# Patient Record
Sex: Female | Born: 1940 | Race: White | Hispanic: No | Smoking: Never smoker
Health system: Southern US, Community
[De-identification: ages and names within clinical notes are randomized; demographics above are authoritative.]

## PROBLEM LIST (undated history)

## (undated) DIAGNOSIS — F419 Anxiety disorder, unspecified: Secondary | ICD-10-CM

## (undated) DIAGNOSIS — K219 Gastro-esophageal reflux disease without esophagitis: Secondary | ICD-10-CM

## (undated) DIAGNOSIS — M1990 Unspecified osteoarthritis, unspecified site: Secondary | ICD-10-CM

## (undated) DIAGNOSIS — F32A Depression, unspecified: Secondary | ICD-10-CM

## (undated) DIAGNOSIS — IMO0002 Reserved for concepts with insufficient information to code with codable children: Secondary | ICD-10-CM

## (undated) DIAGNOSIS — C801 Malignant (primary) neoplasm, unspecified: Secondary | ICD-10-CM

## (undated) DIAGNOSIS — I639 Cerebral infarction, unspecified: Secondary | ICD-10-CM

## (undated) DIAGNOSIS — F329 Major depressive disorder, single episode, unspecified: Secondary | ICD-10-CM

## (undated) DIAGNOSIS — H269 Unspecified cataract: Secondary | ICD-10-CM

## (undated) DIAGNOSIS — F0390 Unspecified dementia without behavioral disturbance: Secondary | ICD-10-CM

## (undated) DIAGNOSIS — I10 Essential (primary) hypertension: Secondary | ICD-10-CM

## (undated) DIAGNOSIS — E785 Hyperlipidemia, unspecified: Secondary | ICD-10-CM

## (undated) DIAGNOSIS — T7840XA Allergy, unspecified, initial encounter: Secondary | ICD-10-CM

## (undated) DIAGNOSIS — E119 Type 2 diabetes mellitus without complications: Secondary | ICD-10-CM

## (undated) DIAGNOSIS — E876 Hypokalemia: Secondary | ICD-10-CM

## (undated) HISTORY — DX: Cerebral infarction, unspecified: I63.9

## (undated) HISTORY — DX: Type 2 diabetes mellitus without complications: E11.9

## (undated) HISTORY — DX: Essential (primary) hypertension: I10

## (undated) HISTORY — DX: Unspecified dementia, unspecified severity, without behavioral disturbance, psychotic disturbance, mood disturbance, and anxiety: F03.90

## (undated) HISTORY — DX: Reserved for concepts with insufficient information to code with codable children: IMO0002

## (undated) HISTORY — DX: Unspecified cataract: H26.9

## (undated) HISTORY — DX: Gastro-esophageal reflux disease without esophagitis: K21.9

## (undated) HISTORY — DX: Hyperlipidemia, unspecified: E78.5

## (undated) HISTORY — DX: Malignant (primary) neoplasm, unspecified: C80.1

## (undated) HISTORY — DX: Anxiety disorder, unspecified: F41.9

## (undated) HISTORY — DX: Major depressive disorder, single episode, unspecified: F32.9

## (undated) HISTORY — DX: Unspecified osteoarthritis, unspecified site: M19.90

## (undated) HISTORY — DX: Depression, unspecified: F32.A

## (undated) HISTORY — DX: Allergy, unspecified, initial encounter: T78.40XA

---

## 2013-02-07 ENCOUNTER — Ambulatory Visit: Payer: Self-pay | Admitting: Podiatrist

## 2013-02-14 ENCOUNTER — Ambulatory Visit: Payer: Self-pay | Admitting: Podiatrist

## 2013-02-21 ENCOUNTER — Encounter: Payer: Self-pay | Admitting: Podiatrist

## 2013-02-21 ENCOUNTER — Encounter (INDEPENDENT_AMBULATORY_CARE_PROVIDER_SITE_OTHER): Payer: Self-pay

## 2013-02-21 ENCOUNTER — Ambulatory Visit (INDEPENDENT_AMBULATORY_CARE_PROVIDER_SITE_OTHER): Payer: Medicare PPO | Admitting: Podiatrist

## 2013-02-21 VITALS — BP 140/67 | HR 73 | Resp 16

## 2013-02-21 DIAGNOSIS — M79609 Pain in unspecified limb: Secondary | ICD-10-CM

## 2013-02-21 DIAGNOSIS — B351 Tinea unguium: Secondary | ICD-10-CM

## 2013-02-21 DIAGNOSIS — L97509 Non-pressure chronic ulcer of other part of unspecified foot with unspecified severity: Secondary | ICD-10-CM

## 2013-02-21 DIAGNOSIS — L97521 Non-pressure chronic ulcer of other part of left foot limited to breakdown of skin: Secondary | ICD-10-CM

## 2013-02-21 MED ORDER — "NEXCARE COBAN WRAP 1""X5YD MISC": 1.0000 "application " | Freq: Every day | Status: DC

## 2013-02-21 NOTE — Patient Instructions (Signed)
Wear your surgical shoe daily- do not go barefoot or wear tight fitting shoes.    Apply iodosorb to your toe every day.  Cover with a gauze and wrap the toe gently (not tight) with the coban wrap to keep the gauze intact.

## 2013-02-21 NOTE — Progress Notes (Signed)
  Subjective:    Patient ID: Shannon [name], female    DOB: August 01, 1940, 72 y.o.   MRN: [id number]  HPI Shannon [name] presents today for followup of left great toe ulcer. She states "it's not doing so great" she states she recently went on a bus trip or her feet swelled.  She states that the wound was healed but it opened up shortly after her trip. She also states it's draining some. Patient also request nail debridement as well as she relates discomfort with ambulation and in shoes.   Review of Systems     Objective:   Physical Exam Neurovascular status is unchanged with palpable symmetric pedal pulses and neurological sensation diminished. She has an ulceration at the distal plantar tip of the left hallux measuring 3 mm in diameter. It is superficial in nature and has healthy pink granular base. The patient's toenails are elongated, thickened, discolored, dystrophic, clinically mycotic and painful.       Assessment & Plan:  Assessment: 1) ulceration left hallux 2) painful mycotic toenails Plan: debrided the ulceration and applied Iodosorb and a dry sterile compressive dressing. debrided the toenails without complication. I will see her back in 2 weeks for followup. Ulcer care instructions were dispensed. A prescription for Coban wrap was dispensed to the patient.

## 2013-03-07 ENCOUNTER — Encounter: Payer: Self-pay | Admitting: Podiatrist

## 2013-03-07 ENCOUNTER — Ambulatory Visit (INDEPENDENT_AMBULATORY_CARE_PROVIDER_SITE_OTHER): Payer: Medicare PPO | Admitting: Podiatrist

## 2013-03-07 VITALS — BP 110/58 | HR 72 | Resp 18

## 2013-03-07 DIAGNOSIS — L97521 Non-pressure chronic ulcer of other part of left foot limited to breakdown of skin: Secondary | ICD-10-CM

## 2013-03-07 DIAGNOSIS — L97509 Non-pressure chronic ulcer of other part of unspecified foot with unspecified severity: Secondary | ICD-10-CM

## 2013-03-07 NOTE — Patient Instructions (Signed)
Your ulcer is healed!  Continue wearing the tubefoam on the toe to protect it from shear in your shoe.  Wean back into your enclosed shoe slowly-- 1 hour the first day/ 2 hours the 2nd and so on.  If you see any redness or breakdown of skin, please call!

## 2013-03-07 NOTE — Progress Notes (Signed)
  Subjective:  Patient presents today for follow up of ulceration of the left great toe.  Patient relates she has been using iodosorb daily.  Relates she has been off of her feet more lately and thinks her toe may be doing OK.  Objective: Neurovascular status is unchanged with palpable symmetric pedal pulses and neurological sensation diminished. She has healed ulceration at the distal plantar tip of the left hallux which has now gone on to heal.  It continues to have some hyperkeratotic tissue overlying the ulcer.  Assessment:  Healed ulcer  Plan:  Debrided the hyperkeratotic tissue, gave instructions for aftercare, patient will be seen back for recheck.

## 2013-03-28 ENCOUNTER — Ambulatory Visit: Payer: Medicare PPO | Admitting: Podiatrist

## 2013-04-04 ENCOUNTER — Encounter: Payer: Self-pay | Admitting: Podiatrist

## 2013-04-04 ENCOUNTER — Ambulatory Visit (INDEPENDENT_AMBULATORY_CARE_PROVIDER_SITE_OTHER): Payer: Medicare PPO | Admitting: Podiatrist

## 2013-04-04 VITALS — BP 137/67 | HR 69 | Resp 18

## 2013-04-04 DIAGNOSIS — L97521 Non-pressure chronic ulcer of other part of left foot limited to breakdown of skin: Secondary | ICD-10-CM

## 2013-04-04 DIAGNOSIS — L97509 Non-pressure chronic ulcer of other part of unspecified foot with unspecified severity: Secondary | ICD-10-CM

## 2013-04-04 NOTE — Progress Notes (Signed)
  Subjective: Patient presents today for follow up of ulceration of the left great toe. Patient relates she has been using using a foam pad around the toe and she thinks the toe is doing well. At the last visit the toe looked healed and we stopped having her use the Iodosorb. No new signs of infection, or complaints are noted.   Objective: Neurovascular status is unchanged with palpable symmetric pedal pulses and neurological sensation diminished. She has a relapse at the ulceration at the distal plantar tip of the left hallux which is now open and has a red granular base. It measures 8 mm in diameter 2 mm in depth. No redness, streaking no lymphangitis or signs of infection present.   Assessment: Relapsed ulcer distal tip left great toe  Plan: Debrided the hyperkeratotic tissue an ulceration., gave instructions for aftercare and use of Iodosorb., We'll contact the representative  from [employer] and see if we can get this product to put on her toe ulcer at the next visit in one week.  Marlowe Aschoff DPM

## 2013-04-04 NOTE — Patient Instructions (Signed)
Start putting the orange IODOSORB back on your toe again.    I will order you a skin graft/ skin substitute that I will put on your toe next week.  You will just need to wear your open surgery shoe while this is on and keep it dry for 1 week.

## 2013-04-11 ENCOUNTER — Ambulatory Visit (INDEPENDENT_AMBULATORY_CARE_PROVIDER_SITE_OTHER): Payer: Medicare PPO | Admitting: Podiatrist

## 2013-04-11 ENCOUNTER — Encounter: Payer: Self-pay | Admitting: Podiatrist

## 2013-04-11 VITALS — BP 145/71 | HR 78 | Resp 18

## 2013-04-11 DIAGNOSIS — L97509 Non-pressure chronic ulcer of other part of unspecified foot with unspecified severity: Secondary | ICD-10-CM

## 2013-04-11 NOTE — Patient Instructions (Signed)
Keep the dressing intact--  i will take off the dressing next week!

## 2013-04-11 NOTE — Progress Notes (Signed)
   Subjective: Patient presents today for follow up of ulceration of the left great toe. Shannon [name] has been using Iodosorb on the toe and states the ulcer is probably still there. No new signs of infection, or complaints are noted.   Objective: Neurovascular status is unchanged with palpable symmetric pedal pulses and neurological sensation diminished. She has a continued relapse at the ulceration at the distal plantar tip of the left hallux which is now open and has a red granular base. It measures 8 mm in diameter 2 mm in depth. No redness, streaking no lymphangitis or signs of infection present.   Assessment: Relapsed ulcer distal tip left great toe   Plan: Debrided the necrotic tissue down to healthy bleeding granular tissue. Applied 5 cm of TheraSkin to the wound according to [employer] instructions. Then applied theragauze followed by Steri-Strips and a dry sterile compressive dressing to the toe. Instructed the patient to keep the dressing on and not remove it. If she notices any redness, swelling, or signs of infection she is instructed to call me immediately I will see her back in one week for recheck of the toe. Of note 1 application of TheraSkin was used with 5 cm square used and 34 cm square wasted.

## 2013-04-18 ENCOUNTER — Ambulatory Visit (INDEPENDENT_AMBULATORY_CARE_PROVIDER_SITE_OTHER): Payer: Medicare PPO | Admitting: Podiatrist

## 2013-04-18 DIAGNOSIS — L97509 Non-pressure chronic ulcer of other part of unspecified foot with unspecified severity: Secondary | ICD-10-CM

## 2013-04-18 MED ORDER — SILVER SULFADIAZINE 1 % EX CREA: 1.0000 "application " | TOPICAL_CREAM | Freq: Every day | CUTANEOUS | Status: DC

## 2013-04-18 NOTE — Patient Instructions (Signed)
Your toe looks great- it is ok to get the toe wet.. Just let the water from a shower run off onto your toe.  Try to avoid any scrubbing of the toe itself.  Apply silvadene cream (white creamy antibiotic ) to the toe and cover with a bandage.

## 2013-04-18 NOTE — Progress Notes (Signed)
subjective: Shannon [name] presents today for followup of Theraskin wound graft on the left great toe. She states she has The dressing clean dry and intact and has followed all instructions. She states she stayed off of her foot is much is possible and she's noticed it having some pangs of discomfort  Objective: Neurovascular status unchanged left hallux wound appears to have healed well status post TheraSkin application. No redness, no swelling, no streaking or lymphangitis present.  Assessment: Healing ulcer status post TheraSkin application left great toe  Plan: Redressed the toe with a dry sterile compressive dressing and instructed her on continued aftercare and staying off of the toe. I will see her back for recheck in 2 weeks if any problems arise prior that visit she will call.

## 2013-05-02 ENCOUNTER — Ambulatory Visit (INDEPENDENT_AMBULATORY_CARE_PROVIDER_SITE_OTHER): Payer: Medicare PPO | Admitting: Podiatrist

## 2013-05-02 ENCOUNTER — Encounter: Payer: Self-pay | Admitting: Podiatrist

## 2013-05-02 ENCOUNTER — Ambulatory Visit: Payer: Medicare PPO | Admitting: Podiatrist

## 2013-05-02 VITALS — BP 132/71 | HR 84 | Resp 18

## 2013-05-02 DIAGNOSIS — L97509 Non-pressure chronic ulcer of other part of unspecified foot with unspecified severity: Secondary | ICD-10-CM

## 2013-05-02 NOTE — Progress Notes (Signed)
   subjective: Shannon [name] presents today for second followup of Theraskin wound graft on the left great toe. She states she has been applying a bandage to the toe as instructed.. She states the toe looks great her and she's having no discomfort.  Objective: Neurovascular status unchanged left hallux wound appears to have healed well status post TheraSkin application. No redness, no swelling, no streaking or lymphangitis present. Very tiny central aspect of the ulceration is continuing to heal. Assessment: Healing ulcer status post TheraSkin application left great toe  Plan: Redressed the toe with a dry sterile compressive dressing and instructed her on continued aftercare and staying off of the toe. I will see her back for a final recheck in 2 weeks if any problems arise prior that visit she will call. Also made her buttress pad for the hallux

## 2013-05-16 ENCOUNTER — Encounter: Payer: Self-pay | Admitting: Podiatrist

## 2013-05-16 ENCOUNTER — Ambulatory Visit (INDEPENDENT_AMBULATORY_CARE_PROVIDER_SITE_OTHER): Payer: Medicare PPO | Admitting: Podiatrist

## 2013-05-16 VITALS — BP 155/76 | HR 79 | Resp 12

## 2013-05-16 DIAGNOSIS — M79609 Pain in unspecified limb: Secondary | ICD-10-CM

## 2013-05-16 DIAGNOSIS — B351 Tinea unguium: Secondary | ICD-10-CM

## 2013-05-16 DIAGNOSIS — L97509 Non-pressure chronic ulcer of other part of unspecified foot with unspecified severity: Secondary | ICD-10-CM

## 2013-05-16 NOTE — Progress Notes (Signed)
Subjective: Shannon [name] presents today for  followup of Theraskin wound graft on the left great toe. She states she has been applying a bandage to the toe as instructed.. She states the toe looks healed to her and she's having no discomfort.   Objective: Neurovascular status unchanged left hallux wound appears to have healed well status post TheraSkin application. No redness, no swelling, no streaking or lymphangitis present. Overall maximal improvement is seen left hallux.  Assessment: Healing ulcer status post TheraSkin application left great toe   Plan: Debrided the small amount of hyperkeratotic tissue on the left hallux. Redressed the toe with a Band-Aid and instructed her on continued aftercare.  I will see her back for routine care in 10 weeks. If she has any problems or concerns prior that visit she is instructed to call.

## 2013-05-29 ENCOUNTER — Telehealth: Payer: Self-pay | Admitting: *Deleted

## 2013-05-29 NOTE — Telephone Encounter (Signed)
Tanya asked if Dr Valentina Lucks would like a PEER TO PEER to discuss the  Non-covered skin substitute that was used, that came back as not medically necessary.  Courtesy to speak to with a [facility] physician or can do receive paperwork by fax to appeal.  Must reply before 05/30/2013 at 1000am.

## 2013-05-30 NOTE — Telephone Encounter (Signed)
Yes-- have them fax over the paperwork to the [place] office and I will do it today--  Thanks.   I will also call Johnette Abraham see why this wasn't covered after initially approved.

## 2013-06-01 NOTE — Telephone Encounter (Signed)
I left a message for Dr. Para March about the specifics of the wound and the course of conservative therapy.  I received a note today stating the theraskin was deemed covered.

## 2013-07-25 ENCOUNTER — Ambulatory Visit (INDEPENDENT_AMBULATORY_CARE_PROVIDER_SITE_OTHER): Payer: Medicare PPO | Admitting: Podiatrist

## 2013-07-25 ENCOUNTER — Encounter: Payer: Self-pay | Admitting: Podiatrist

## 2013-07-25 VITALS — BP 110/57 | HR 75 | Resp 18

## 2013-07-25 DIAGNOSIS — L97509 Non-pressure chronic ulcer of other part of unspecified foot with unspecified severity: Secondary | ICD-10-CM

## 2013-07-25 DIAGNOSIS — B351 Tinea unguium: Secondary | ICD-10-CM

## 2013-07-25 DIAGNOSIS — M79609 Pain in unspecified limb: Secondary | ICD-10-CM

## 2013-07-25 NOTE — Progress Notes (Signed)
   HPI: Patient presents today for follow up of diabetic foot and nail care. Past medical history, meds, and allergies reviewed. Patient states blood sugar is under good  control. Patient states the left big toe is doing better however she is concerned that it appears bruised.  She also had skin cancers removed on her right leg.    Objective:   Objective:  Patients chart is reviewed.  Vascular status reveals pedal pulses noted at  2 out of 4 dp and pt bilateral .  Neurological sensation is Decreased to [employer] monofilament bilateral at 0/5 sites bilateral.  Dermatological exam reveals  presence of ulceration is noted at the distal tip of the left 2nd toe-- once debridement carried out, full thickness ulcer has returned.  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  All Toenails are elongated, incurvated, discolored, dystrophic with ingrown deformity present.    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 Debrided nails without complication. Debrided ulceration without complication.  Applied a DSD and gave patient instructions for wound care.  I will see her back in 1 week-- may consider an flexor tendon release to remove the severe pull of the flexor tendon.   Other option would be a fusion which would be difficult for her to stay off her foot long enough to fuse the joint.

## 2013-07-25 NOTE — Patient Instructions (Signed)
Resume your wound care to the left great toe with iodosorb and a dressing.  It is very superficial and should close easily in 2 weeks.

## 2013-08-15 ENCOUNTER — Ambulatory Visit (INDEPENDENT_AMBULATORY_CARE_PROVIDER_SITE_OTHER): Payer: Medicare PPO | Admitting: Podiatrist

## 2013-08-15 VITALS — BP 117/56 | HR 66 | Resp 18

## 2013-08-15 DIAGNOSIS — L97509 Non-pressure chronic ulcer of other part of unspecified foot with unspecified severity: Secondary | ICD-10-CM

## 2013-08-15 NOTE — Patient Instructions (Signed)
Keep using iodosorb on the toe.  i will see you back in 2 weeks for a probable application of a wound healing graft like you had before.

## 2013-08-15 NOTE — Progress Notes (Signed)
Shannon [name] presents today for follow up of ulceration on the left great toe.  The ulcer has come back and she has been applying the iodosorb to the toe.  She relates she cannot feel or see the ulcer and doesn't know if it is better or not. She denies any signs of infection locally or systemically.    Objective: Objective: Patients chart is reviewed. Vascular status reveals pedal pulses noted at 2 out of 4 dp and pt bilateral . Neurological sensation is Decreased to [employer] monofilament bilateral at 0/5 sites bilateral. Dermatological exam reveals presence of ulceration is noted at the distal tip of the left 2nd toe--  full thickness ulcer is present and measures 41mm in diameter and 14mm in depth.  Appears to have gotten worse since the last visit. 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 This is the toe that required Theraskin to heal last time and it looks like it will need another graft.  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 Debrided ulceration without complication. Applied a DSD and gave patient instructions for wound care. I would like to try another theraskin on the toe as this is the only thing that was ever able to heal the toe.  I will see about ordering it for her next visit.  Also discussed surgery to straighten out the great toe and to release the tendon on the 3rd toe of the left foot.  It is severely contracted as well.

## 2013-08-29 ENCOUNTER — Ambulatory Visit (INDEPENDENT_AMBULATORY_CARE_PROVIDER_SITE_OTHER): Payer: Medicare PPO | Admitting: Podiatrist

## 2013-08-29 ENCOUNTER — Encounter: Payer: Self-pay | Admitting: Podiatrist

## 2013-08-29 VITALS — BP 109/54 | HR 56 | Resp 18

## 2013-08-29 DIAGNOSIS — L97509 Non-pressure chronic ulcer of other part of unspecified foot with unspecified severity: Secondary | ICD-10-CM | POA: Diagnosis not present

## 2013-08-29 NOTE — Progress Notes (Signed)
I can't see if it is draining but I don't think so on my left great toe  Shannon [name] presents today for follow up of ulceration on the left great toe. The ulcer has come back and she has been applying the iodosorb to the toe. She relates she cannot feel or see the ulcer and doesn't know if it is better or not. She denies any signs of infection locally or systemically.  Objective:  Patients chart is reviewed. Vascular status reveals pedal pulses noted at 2 out of 4 dp and pt bilateral . Neurological sensation is Decreased to [employer] monofilament bilateral at 0/5 sites bilateral. Dermatological exam reveals presence of ulceration is noted at the distal tip of the left 2nd toe-- full thickness ulcer is present and continues to measure 77mm in diameter and 46mm in depth a fibrous base is present centrally. No redness, no swelling, no signs of infection are present. 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 Debrided ulceration without complication. Applied a DSD and gave patient instructions for wound care. I'm going to have her try some collagen this week to see how it goes on the toe. If this is not beneficial I would like to try another theraskin on the toe as this is the only thing that was ever able to heal the toe. I will see about ordering it for her next visit.

## 2013-09-12 ENCOUNTER — Encounter: Payer: Self-pay | Admitting: Podiatrist

## 2013-09-12 ENCOUNTER — Ambulatory Visit (INDEPENDENT_AMBULATORY_CARE_PROVIDER_SITE_OTHER): Payer: Medicare PPO | Admitting: Podiatrist

## 2013-09-12 VITALS — BP 119/49 | HR 61 | Resp 18

## 2013-09-12 DIAGNOSIS — L97509 Non-pressure chronic ulcer of other part of unspecified foot with unspecified severity: Secondary | ICD-10-CM

## 2013-09-12 NOTE — Progress Notes (Signed)
I think it is doing ok on my left big toe.  I pulled off a piece of skin on the toe and it started bleeding the other day.   Shannon [name] presents today for follow up of ulceration on the left great toe.  she has been applying the iodosorb to the toe. She relates she cannot feel or see the ulcer and doesn't know if it is better or not. She denies any signs of infection locally or systemically.   Objective: Patients chart is reviewed. Vascular status reveals pedal pulses noted at 2 out of 4 dp and pt bilateral . Neurological sensation is Decreased to [employer] monofilament bilateral at 0/5 sites bilateral. Dermatological exam reveals presence of ulceration is noted at the distal tip of the left 2nd toe-- full thickness ulcer is present and  measures 21mm in diameter and 12mm in depth a fibrous base is present centrally. No redness, no swelling, no signs of infection are present. 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 Debrided ulceration without complication. Applied a iodosorb and a DSD and gave patient instructions for wound care.she will continue with the iodosorb.  If this is not beneficial I would like to try another theraskin on the toe as this is the only thing that was ever able to heal the toe.

## 2013-09-12 NOTE — Patient Instructions (Signed)
Continue with dressing changes on your 2nd toe.  Call if any redness, swelling or signs of infection arise-- its improved.. Almost healed!!

## 2013-10-03 ENCOUNTER — Ambulatory Visit (INDEPENDENT_AMBULATORY_CARE_PROVIDER_SITE_OTHER): Payer: Medicare PPO | Admitting: Podiatrist

## 2013-10-03 ENCOUNTER — Encounter: Payer: Self-pay | Admitting: Podiatrist

## 2013-10-03 VITALS — BP 100/57 | HR 61 | Resp 18

## 2013-10-03 DIAGNOSIS — L97509 Non-pressure chronic ulcer of other part of unspecified foot with unspecified severity: Secondary | ICD-10-CM

## 2013-10-03 NOTE — Progress Notes (Signed)
I THINK THAT IT IS GETTING BETTER ON MY LEFT BIG TOE  Shannon [name] presents today for follow up of ulceration on the left great toe. she has been applying the iodosorb to the toe. She relates she cannot feel or see the ulcer and doesn't know if it is better or not. She denies any signs of infection locally or systemically.   Objective: Patients chart is reviewed. Vascular status reveals pedal pulses noted at 2 out of 4 dp and pt bilateral . Neurological sensation is Decreased to [employer] monofilament bilateral at 0/5 sites bilateral. Dermatological exam reveals presence of ulceration is noted at the distal tip of the left 2nd toe-- full thickness ulcer is present and measures 64mm in diameter and 69mm in depth with a granular base. No redness, no swelling, no signs of infection are present. 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 Debrided ulceration without complication. Applied a iodosorb and a DSD and gave patient instructions for wound care. We are switching her to silver collagen dressings.  I will see her back in 2 weeks for follow up.  Today discussed a tenotomy to help with the pressure placed on the toe.

## 2013-10-17 ENCOUNTER — Ambulatory Visit (INDEPENDENT_AMBULATORY_CARE_PROVIDER_SITE_OTHER): Payer: Medicare PPO | Admitting: Podiatrist

## 2013-10-17 ENCOUNTER — Encounter: Payer: Self-pay | Admitting: Podiatrist

## 2013-10-17 VITALS — BP 122/66 | HR 65 | Resp 18

## 2013-10-17 DIAGNOSIS — L97521 Non-pressure chronic ulcer of other part of left foot limited to breakdown of skin: Secondary | ICD-10-CM

## 2013-10-17 DIAGNOSIS — L97509 Non-pressure chronic ulcer of other part of unspecified foot with unspecified severity: Secondary | ICD-10-CM

## 2013-10-17 MED ORDER — FLUCONAZOLE 150 MG PO TABS: 150.0000 mg | ORAL_TABLET | Freq: Once | ORAL | Status: DC

## 2013-10-17 MED ORDER — CEPHALEXIN 500 MG PO CAPS: 500.0000 mg | ORAL_CAPSULE | Freq: Three times a day (TID) | ORAL | Status: DC

## 2013-10-17 NOTE — Patient Instructions (Signed)
Switch back to iodosorb on the toe-- also, wear your surgical sandal as much as you can-- bring it in at the next visit so I can modify it so your toe will float more.  Any pressure of your toe on the ground causes the ulcer to stay open.

## 2013-10-17 NOTE — Progress Notes (Signed)
Shannon [name] presents today for follow up of ulceration on the left great toe. she has been applying the silver alginate to the toe. She relates she cannot feel or see the ulcer and doesn't know if it is better or not. She relates she noticed redness around the toe and up the 2nd toe and the physician at [facility] put her on an antibiotic for 7 days.  Objective:  Vascular status reveals pedal pulses noted at 2 out of 4 dp and pt bilateral . Neurological sensation is Decreased to [employer] monofilament bilateral at 0/5 sites bilateral. Dermatological exam reveals continued presence of ulceration is noted at the distal tip of the left Hallux- full thickness ulcer is present and measures 39mm in diameter and 71mm in depth with a granular base. No redness, no swelling, no signs of infection are present. Fissure is present medial side of left hallux as well.  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 Debrided ulceration without complication. Applied a iodosorb and a DSD and gave patient instructions for wound care. We are switching her back to iodosorb from silver collagen dressings. i am starting her on keflex tid and diflucan x 1.   I will see her back in 1 weeks for follow up. I had discussed a tenotomy in the past however with the rigidity of the joint, don't feel like it would be very beneficial.  She is wearing regular shoes today. I told her to go back into her darco shoe and bring it with her at the next visit so I can offload the toe further.

## 2013-10-24 ENCOUNTER — Encounter: Payer: Self-pay | Admitting: Podiatrist

## 2013-10-24 ENCOUNTER — Ambulatory Visit (INDEPENDENT_AMBULATORY_CARE_PROVIDER_SITE_OTHER): Payer: Medicare PPO | Admitting: Podiatrist

## 2013-10-24 VITALS — BP 114/51 | HR 70 | Resp 18

## 2013-10-24 DIAGNOSIS — L97521 Non-pressure chronic ulcer of other part of left foot limited to breakdown of skin: Secondary | ICD-10-CM

## 2013-10-24 DIAGNOSIS — L97509 Non-pressure chronic ulcer of other part of unspecified foot with unspecified severity: Secondary | ICD-10-CM

## 2013-10-24 NOTE — Progress Notes (Signed)
I THINK THAT IT IS DOING GOOD ON MY LEFT BIG TOE  Shannon [name] presents today for follow up of ulceration on the left great toe. she has been applying the Iodosorb gel to the toe. She relates she cannot feel or see the ulcer and doesn't know if it is better or not. She relates it is on the toe and around the second toe has improved since being on antibiotics  Objective: Vascular status reveals pedal pulses noted at 2 out of 4 dp and pt bilateral . Neurological sensation is Decreased to [employer] monofilament bilateral at 0/5 sites bilateral. Dermatological exam reveals continued presence of ulceration is noted at the distal tip of the left Hallux- full thickness ulcer is present and measures 2 mm in diameter and 31mm in depth with a granular base. No redness, no swelling, no signs of infection are present. Healing Fissure is present medial side of left hallux as well. 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 Debrided ulceration without complication. Applied a iodosorb and a DSD and gave patient instructions for wound care. She continues to wear her normal shoes  despite recommendations otherwise. She'll be seen back in 2 weeks for followup.

## 2013-11-07 ENCOUNTER — Encounter: Payer: Self-pay | Admitting: Podiatrist

## 2013-11-07 ENCOUNTER — Ambulatory Visit (INDEPENDENT_AMBULATORY_CARE_PROVIDER_SITE_OTHER): Payer: Medicare PPO | Admitting: Podiatrist

## 2013-11-07 VITALS — BP 117/67 | HR 66 | Resp 12

## 2013-11-07 DIAGNOSIS — L97521 Non-pressure chronic ulcer of other part of left foot limited to breakdown of skin: Secondary | ICD-10-CM

## 2013-11-07 DIAGNOSIS — L97509 Non-pressure chronic ulcer of other part of unspecified foot with unspecified severity: Secondary | ICD-10-CM

## 2013-11-08 NOTE — Progress Notes (Signed)
Arbie Cookey presents today for follow up of ulceration on the left great toe. she has been applying the Iodosorb gel to the toe. She relates she cannot feel or see the ulcer and doesn't know if it is better or not.   Objective: Vascular status reveals pedal pulses noted at 2 out of 4 dp and pt bilateral . Neurological sensation is Decreased to [employer] monofilament bilateral at 0/5 sites bilateral. Dermatological exam reveals continued presence of ulceration is noted at the distal tip of the left Hallux- full thickness ulcer is present and measures 4 mm in diameter and 54mm in depth with a granular base. No redness, no swelling, no signs of infection are present. 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 Recommended theraskin since the ulcer is not healing despite conservative care and offloading.  Debrided ulceration without complication. Applied hydrogel and a DSD and gave patient instructions for wound care. She will be seen back next week for anticipated theraskin application.  Will check her insurance coverage to make sure it is a covered product.

## 2013-11-14 ENCOUNTER — Ambulatory Visit (INDEPENDENT_AMBULATORY_CARE_PROVIDER_SITE_OTHER): Payer: Medicare PPO | Admitting: Podiatrist

## 2013-11-14 ENCOUNTER — Encounter: Payer: Self-pay | Admitting: Podiatrist

## 2013-11-14 VITALS — BP 120/64 | HR 89 | Resp 18

## 2013-11-14 DIAGNOSIS — L97521 Non-pressure chronic ulcer of other part of left foot limited to breakdown of skin: Secondary | ICD-10-CM

## 2013-11-14 DIAGNOSIS — L97509 Non-pressure chronic ulcer of other part of unspecified foot with unspecified severity: Secondary | ICD-10-CM

## 2013-11-14 NOTE — Progress Notes (Signed)
Shannon [name] presents today for follow up of ulceration on the left great toe. she has been applying the Iodosorb gel to the toe. She states she saw blood when she got out of the shower.   Objective: Vascular status reveals pedal pulses noted at 2 out of 4 dp and pt bilateral . Neurological sensation is Decreased to [employer] monofilament bilateral at 0/5 sites bilateral. Dermatological exam reveals continued presence of ulceration is noted at the distal tip of the left Hallux- full thickness ulcer is present and measures 4 mm in diameter and 8mm in depth with a granular base. No redness, no swelling, no signs of infection are present. 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 Recommended theraskin since the ulcer is not healing despite conservative care and offloading. We will order the graft for the next visit next week. Debrided ulceration without complication. Applied hydrogel and a DSD and gave patient instructions for wound care. She will be seen back next week for anticipated theraskin application.

## 2013-11-21 ENCOUNTER — Ambulatory Visit (INDEPENDENT_AMBULATORY_CARE_PROVIDER_SITE_OTHER): Payer: Medicare PPO | Admitting: Podiatrist

## 2013-11-21 ENCOUNTER — Encounter: Payer: Self-pay | Admitting: Podiatrist

## 2013-11-21 VITALS — BP 132/61 | HR 68 | Resp 18

## 2013-11-21 DIAGNOSIS — L97509 Non-pressure chronic ulcer of other part of unspecified foot with unspecified severity: Secondary | ICD-10-CM

## 2013-11-21 DIAGNOSIS — L97512 Non-pressure chronic ulcer of other part of right foot with fat layer exposed: Secondary | ICD-10-CM

## 2013-11-21 NOTE — Progress Notes (Signed)
Shannon [name] presents today for follow up of ulceration on the left great toe. she has been applying the Iodosorb gel to the toe. She states she has seen no improvement in the last several weeks from using topical therapies we have tried in the past including silver, hydrogel, Iodosorb, wet-to-dry dressings. She presents today for her TheraSkin application of the left great toe.   Objective: Vascular status reveals pedal pulses noted at 2 out of 4 dp and pt bilateral . Neurological sensation is Decreased to [employer] monofilament bilateral at 0/5 sites bilateral. Dermatological exam reveals continued presence of ulceration is noted at the distal tip of the left Hallux- full thickness ulcer is present and measures 25mm in diameter and 73mm in depth with a granular base. No redness, no swelling, no signs of infection are present. 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  Plan: Discussed treatment options and alternatives. The deep and thorough debridement was carried out today to prepare this site for her TheraSkin application. The TheraSkin was then applied according to manufacturer's instructions and held in place with Steri-Strips. Theragauze was then applied followed by a fluffy dry sterile compressive dressing. The patient was given instructions to keep the area dry and to stay off of the foot for the next week. A Darco shoe was dispensed and I will see her back in one week for followup.

## 2013-11-21 NOTE — Patient Instructions (Signed)
Keep the bandage in place for the next week-- we will remove it next week

## 2013-11-28 ENCOUNTER — Encounter: Payer: Self-pay | Admitting: Podiatrist

## 2013-11-28 ENCOUNTER — Ambulatory Visit (INDEPENDENT_AMBULATORY_CARE_PROVIDER_SITE_OTHER): Payer: Medicare PPO | Admitting: Podiatrist

## 2013-11-28 VITALS — BP 119/63 | HR 86 | Resp 18

## 2013-11-28 DIAGNOSIS — L97509 Non-pressure chronic ulcer of other part of unspecified foot with unspecified severity: Secondary | ICD-10-CM

## 2013-11-28 DIAGNOSIS — L97512 Non-pressure chronic ulcer of other part of right foot with fat layer exposed: Secondary | ICD-10-CM

## 2013-11-28 NOTE — Progress Notes (Signed)
subjective: Arbie Cookey presents today for followup of Theraskin wound graft on the left great toe. She states she has The dressing clean dry and intact and has followed all instructions. She states she stayed off of her foot and stay in her bed all week. She denies any systemic or localized sign of infection   Objective: Neurovascular status unchanged left hallux. The dressing was carefully removed and the TheraSkin appears to be intact and intagrating within the ulceration. . No redness, no swelling, no streaking or lymphangitis present.   Assessment: Healing ulcer status post TheraSkin application left great toe   Plan: Redressed the toe with a moistened theragauze and dry sterile compressive dressing and instructed her on continued aftercare and staying off of the toe and keeping the foot dry.. I will see her back for recheck in 1 week if any problems arise prior that visit she will call.

## 2013-12-05 ENCOUNTER — Encounter: Payer: Self-pay | Admitting: Podiatrist

## 2013-12-05 ENCOUNTER — Ambulatory Visit (INDEPENDENT_AMBULATORY_CARE_PROVIDER_SITE_OTHER): Payer: Medicare PPO | Admitting: Podiatrist

## 2013-12-05 VITALS — BP 139/72 | HR 90 | Resp 18

## 2013-12-05 DIAGNOSIS — L97509 Non-pressure chronic ulcer of other part of unspecified foot with unspecified severity: Secondary | ICD-10-CM

## 2013-12-05 DIAGNOSIS — L97512 Non-pressure chronic ulcer of other part of right foot with fat layer exposed: Secondary | ICD-10-CM

## 2013-12-08 NOTE — Progress Notes (Signed)
subjective: Shannon [name] presents today for followup of Theraskin wound graft on the left great toe. She states she has kept the dressing clean dry and intact and has been wearing the surgical sandal as instructed. She denies any pain to the toe however states she feels "twinges".  She denies any systemic or localized sign of infection   Objective: Neurovascular status unchanged left hallux. The dressing was  removed and the TheraSkin appears to be intact and intagrating within the ulceration. . No redness, no swelling, no streaking or lymphangitis present.  Left hallux still plantarflexed and semi rigid contracture.   Assessment: Healing ulcer status post TheraSkin application left great toe   Plan: Redressed the toe with a moistened theragauze and dry sterile compressive dressing and instructed her on continued aftercare.  I will see her back for recheck in 2 weeks if any problems arise prior that visit she will call.

## 2013-12-19 ENCOUNTER — Encounter: Payer: Self-pay | Admitting: Podiatrist

## 2013-12-19 ENCOUNTER — Ambulatory Visit (INDEPENDENT_AMBULATORY_CARE_PROVIDER_SITE_OTHER): Payer: Medicare PPO | Admitting: Podiatrist

## 2013-12-19 VITALS — BP 104/64 | HR 70 | Resp 12

## 2013-12-19 DIAGNOSIS — L97521 Non-pressure chronic ulcer of other part of left foot limited to breakdown of skin: Secondary | ICD-10-CM

## 2013-12-19 DIAGNOSIS — L97509 Non-pressure chronic ulcer of other part of unspecified foot with unspecified severity: Secondary | ICD-10-CM

## 2013-12-19 NOTE — Progress Notes (Signed)
subjective: Shannon [name] presents today for followup of Theraskin wound graft on the left great toe. She states she has had no drainage from the toe in overall is improved with the appearance. She denies any systemic or localized sign of infection   Objective: Neurovascular status unchanged left hallux. The ulceration is healed. There is a slight area of calloused tissue which was debrided reveals intact integument beneath. Left hallux still plantarflexed and semi rigid contracture.   Assessment: Healed ulcer status post TheraSkin application left great toe   Plan: Redressed the toe with Silvadene cream and a cloth Band-Aid. Recommended continuing to stay off of her foot for the toe can completely heal in the skin can heal fully. I will see her back in 3 weeks for followup. If at any time she has any problems or concerns she is instructed to call.

## 2014-01-09 ENCOUNTER — Ambulatory Visit (INDEPENDENT_AMBULATORY_CARE_PROVIDER_SITE_OTHER): Payer: Medicare PPO | Admitting: Podiatrist

## 2014-01-09 ENCOUNTER — Encounter: Payer: Self-pay | Admitting: Podiatrist

## 2014-01-09 VITALS — BP 119/62 | HR 75 | Resp 18

## 2014-01-09 DIAGNOSIS — L97521 Non-pressure chronic ulcer of other part of left foot limited to breakdown of skin: Secondary | ICD-10-CM

## 2014-01-09 DIAGNOSIS — L97509 Non-pressure chronic ulcer of other part of unspecified foot with unspecified severity: Secondary | ICD-10-CM

## 2014-01-09 NOTE — Progress Notes (Signed)
subjective: Shannon [name] presents today for followup of Theraskin wound graft on the left great toe. She states she has had no drainage from the toe in overall is improved with the appearance. She denies any systemic or localized sign of infection  Objective: Neurovascular status unchanged left hallux. The ulceration is healed completely.  Left hallux still plantarflexed and semi rigid contracture.  Assessment: Healed ulcer status post TheraSkin application left great toe   Plan: patient to watch closely for skin breakdown or changes.  She is to limit activities and not be on her foot too excessive.  She will be seen back as needed for follow up

## 2014-02-21 ENCOUNTER — Telehealth: Payer: Self-pay | Admitting: *Deleted

## 2014-02-21 NOTE — Telephone Encounter (Signed)
Received a phone call from [facility] stating that they had denied the claim for Thera Skin as not medically necessary.  Wanted to offer you the opportunity to call for a peer to peer to get it authorized.  [phone]

## 2014-05-03 ENCOUNTER — Ambulatory Visit (INDEPENDENT_AMBULATORY_CARE_PROVIDER_SITE_OTHER): Payer: Medicare PPO

## 2014-05-03 VITALS — BP 120/60 | HR 74 | Resp 12

## 2014-05-03 DIAGNOSIS — Q828 Other specified congenital malformations of skin: Secondary | ICD-10-CM

## 2014-05-03 DIAGNOSIS — E1140 Type 2 diabetes mellitus with diabetic neuropathy, unspecified: Secondary | ICD-10-CM

## 2014-05-03 DIAGNOSIS — M79676 Pain in unspecified toe(s): Secondary | ICD-10-CM

## 2014-05-03 DIAGNOSIS — B351 Tinea unguium: Secondary | ICD-10-CM

## 2014-05-03 DIAGNOSIS — L97521 Non-pressure chronic ulcer of other part of left foot limited to breakdown of skin: Secondary | ICD-10-CM

## 2014-05-03 NOTE — Progress Notes (Signed)
   Subjective:    Patient ID: Shannon [name], female    DOB: Aug 16, 1940, 74 y.o.   MRN: [id number]  HPI  ''LT FOOT GREAT TOE IS [facility] BETTER.''  Review of Systems no new findings or systemic changes noted     Objective:   Physical Exam Neurovascular status is unchanged patient does have diminished vascular status pedal pulses DP +2 PT +1 bilateral Refill time 3 seconds to 4 seconds all digits epicritic and proprioceptive sensations grossly diminished on [employer]. There is normal plantar response DTRs not listed. 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 Patient has hallux malleus on the left foot with hammertoe deformities 34 and 5 left second left is been straightened patient is contractures of toes 234 and 5 on the right. Multiple keratoses are noted particular under the distal phalanx of the left hallux this is a resolved ulceration no active ulcer no open wounds no secondary infections. Patient is wearing shoes that are likely an appropriate for diabetic slip on type canvas loafer does have diabetic shoes but hasn't had them replaced in the several years. There is a concern about the cost patient advised that should be covered by her insurance and I would recommend new diabetic shoe sometime in the future. Patient will discuss this with her family and follow-up for at this time the ulcer site his resolved left great toe however keratoses is present pinch callus both hallux are still identified as well with a keratoses are debrided at this time and as well as multiple dystrophic nails thick brittle dystrophic friable gratified nails 1 through 5 bilateral.       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 Multiple painful dystrophic frontal mycotic nails debrided 1 through 5 bilateral also debrided multiple  keratoses return for future palliative care every 3 months as recommended next  Harriet Masson DPM

## 2014-08-02 ENCOUNTER — Ambulatory Visit: Payer: Medicare Other

## 2014-08-09 ENCOUNTER — Ambulatory Visit: Payer: Medicare PPO | Admitting: Podiatrist

## 2014-08-17 ENCOUNTER — Ambulatory Visit: Payer: Medicare PPO | Admitting: Podiatrist

## 2014-09-06 ENCOUNTER — Ambulatory Visit (INDEPENDENT_AMBULATORY_CARE_PROVIDER_SITE_OTHER): Payer: Medicare PPO | Admitting: Podiatrist

## 2014-09-06 ENCOUNTER — Encounter: Payer: Self-pay | Admitting: Podiatrist

## 2014-09-06 VITALS — BP 132/73 | HR 69 | Resp 18

## 2014-09-06 DIAGNOSIS — M79673 Pain in unspecified foot: Secondary | ICD-10-CM

## 2014-09-06 DIAGNOSIS — B351 Tinea unguium: Secondary | ICD-10-CM

## 2014-09-06 NOTE — Progress Notes (Signed)
Subjective: Arbie Cookey presents today for continued diabetic foot nail care. She states that the ulcer has resolved and she's had no trouble from the left great toe recently. Overall she states she is doing well.  Objective: Neurovascular status unchanged with palpable pulses at 1/4 PT bilateral and 2/4 DP bilateral. Capillary refill time is within normal limits. Profound peripheral neuropathy is noted bilateral. 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 Mild digital contractures are also present on the right foot. Toenails are thickened, discolored, dystrophic and clinically mycotic bilateral.  Assessment: Symptomatic and mycotic nails, resolved ulcer sub-hallux left foot  Plan: Debridement of nails is accomplished today without complication. Recommended the continued use of supportive footwear. She is a candidate for diabetic shoes and if she wishes to get some she will let us know. Patient will be seen back for routine care every 3 months.

## 2014-10-17 ENCOUNTER — Encounter: Payer: Self-pay | Admitting: Podiatry

## 2014-10-17 ENCOUNTER — Ambulatory Visit (INDEPENDENT_AMBULATORY_CARE_PROVIDER_SITE_OTHER): Payer: Medicare PPO | Admitting: Podiatry

## 2014-10-17 VITALS — BP 120/69 | HR 91 | Resp 18

## 2014-10-17 DIAGNOSIS — L89892 Pressure ulcer of other site, stage 2: Secondary | ICD-10-CM | POA: Diagnosis not present

## 2014-10-17 DIAGNOSIS — L03032 Cellulitis of left toe: Secondary | ICD-10-CM | POA: Diagnosis not present

## 2014-10-17 DIAGNOSIS — L97522 Non-pressure chronic ulcer of other part of left foot with fat layer exposed: Secondary | ICD-10-CM

## 2014-10-17 NOTE — Progress Notes (Signed)
Subjective:     Patient ID: Shannon [name], female   DOB: 1940-05-08, 74 y.o.   MRN: [id number]  HPIThis patient presents to the office with red and swollen and painful third toe left foot.  She has redness and swelling moving over the foot extending to the midfoot.  She says she developed this redness last Wednesday and the toe worsened until today.  She has watched the redness worsen daily.  She says her doctor from the home put her on cephalexin 500 mg. Last night.  She has taken only twi pills.  She says this may have developed from wasp bite on her toe.  She presents for evaluation and treatment.   Review of Systems     Objective:   Physical Exam Objective: Review of past medical history, medications, social history and allergies were performed.  Vascular: Dorsalis pedis and posterior tibial pulses were palpable B/L, capillary refill was  WNL B/L, temperature gradient was WNL B/L   Skin:  White necrotic tissue at distal aspect third toe left foot.  Good granulation upon debridement.  No drainage or malodor noted.  The hemostat goes to bone. .  Intensely red swollen  Third toe left with mild redness but swelling 2-3+ over dorsum left foot.  Nails: appear healthy with no signs of mycosis or infections  Sensory: [employer] monoifilament diminished   Orthopedic: Orthopedic evaluation demonstrates all joints distal t ankle have full ROM without crepitus, muscle power WNL B/L. Contracted third toe left foot.     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     Plan:     Debride necrotic tissue.  Neosporin/DSD.  Told her to take antibiotics 4x day.  To call Friday to update her condition.  Possible needs doxycycline.  She has not taken enough cephalexin to determine its benefits.

## 2014-10-24 ENCOUNTER — Ambulatory Visit (INDEPENDENT_AMBULATORY_CARE_PROVIDER_SITE_OTHER): Payer: Medicare PPO | Admitting: Podiatry

## 2014-10-24 ENCOUNTER — Encounter: Payer: Self-pay | Admitting: Podiatry

## 2014-10-24 VITALS — BP 108/68 | HR 82 | Resp 12

## 2014-10-24 DIAGNOSIS — L97421 Non-pressure chronic ulcer of left heel and midfoot limited to breakdown of skin: Secondary | ICD-10-CM | POA: Diagnosis not present

## 2014-10-24 DIAGNOSIS — L03032 Cellulitis of left toe: Secondary | ICD-10-CM

## 2014-10-24 DIAGNOSIS — E1140 Type 2 diabetes mellitus with diabetic neuropathy, unspecified: Secondary | ICD-10-CM

## 2014-10-24 DIAGNOSIS — L97522 Non-pressure chronic ulcer of other part of left foot with fat layer exposed: Secondary | ICD-10-CM

## 2014-10-24 MED ORDER — DOXYCYCLINE HYCLATE 100 MG PO TABS: 100.0000 mg | ORAL_TABLET | Freq: Two times a day (BID) | ORAL | Status: DC

## 2014-10-25 NOTE — Progress Notes (Signed)
Subjective:     Patient ID: Shannon [name], female   DOB: [birth year], 74 y.o.   MRN: [id number]  HPIThis patient returns for evaluation of her third toe diabetic ulcer with cellulitis.  She was given cephalexin by other doctor to be taken.  I told her to continue on the cephalexin since she had only taken 2 pills when she was previously evaluated.  She was told to call me last Friday to tell me if the infection subsided.  She says she was told I was not in the office that day.  The toe remains localized red with open ulcer on tip of third toe.   Review of Systems     Objective:   Physical Exam Objective: Review of past medical history, medications, social history and allergies were performed.  Vascular: Dorsalis pedis and posterior tibial pulses were palpable B/L, capillary refill was  WNL B/L, temperature gradient was WNL B/L   Skin:  Persistant diabetic ulcer distal aspect third toe.  Necrotic tissue in the diabetic ulcer.  No drainage or malodor found.  The swelling in toe has subsided mildly.  No signs of streaking in lower foot/ankle.  Nails: appear healthy with no signs of mycosis or infections  Sensory: [employer] monifilament WNL   Orthopedic: Orthopedic evaluation demonstrates all joints distal t ankle have full ROM without crepitus, muscle power WNL B/L    Assessment:     Infected diabetic ulcer third toe left foot.     Plan:     Debride necrotic tissue down to fat layer.  Prescribed doxycycline for infection.  Continue home soaks and bandage site.  RTC 1 week.. To consider amputation if the antibiotics do not help resolve the infection..  To xray next visit to check on bony changes third toe left foot.

## 2014-10-29 ENCOUNTER — Ambulatory Visit (INDEPENDENT_AMBULATORY_CARE_PROVIDER_SITE_OTHER): Payer: Medicare PPO

## 2014-10-29 ENCOUNTER — Ambulatory Visit (INDEPENDENT_AMBULATORY_CARE_PROVIDER_SITE_OTHER): Payer: Medicare PPO | Admitting: Podiatry

## 2014-10-29 ENCOUNTER — Encounter: Payer: Self-pay | Admitting: Podiatry

## 2014-10-29 VITALS — BP 114/70 | HR 82 | Temp 96.8°F | Resp 18

## 2014-10-29 DIAGNOSIS — L97522 Non-pressure chronic ulcer of other part of left foot with fat layer exposed: Secondary | ICD-10-CM | POA: Diagnosis not present

## 2014-10-29 DIAGNOSIS — E1140 Type 2 diabetes mellitus with diabetic neuropathy, unspecified: Secondary | ICD-10-CM

## 2014-10-29 DIAGNOSIS — L97421 Non-pressure chronic ulcer of left heel and midfoot limited to breakdown of skin: Secondary | ICD-10-CM | POA: Diagnosis not present

## 2014-10-29 DIAGNOSIS — R52 Pain, unspecified: Secondary | ICD-10-CM

## 2014-10-29 NOTE — Progress Notes (Signed)
Subjective:     Patient ID: Shannon [name], female   DOB: [birth year], 74 y.o.   MRN: [id number]  HPIThis patient returns to office for continued evaluation and care third toe left foot.  The third toe has remained red but no pain is noted.  She has been soaking her foot and bandaging her toe.  No swelling or redness over top of left foot.   Review of Systems     Objective:   Physical Exam Objective: Review of past medical history, medications, social history and allergies were performed.  Vascular: Dorsalis pedis and posterior tibial pulses were palpable B/L, capillary refill was  WNL B/L, temperature gradient was WNL B/L   Skin: persistant diabetic ulcer distal aspect third toe.  No drainage or malodor noted.  Healthy grnaulation tissue noted third toe left foot.  Nails: appear healthy with no signs of mycosis or infections  Sensory: [employer] monifilament WNL   Orthopedic: Orthopedic evaluation demonstrates all joints distal t ankle have full ROM without crepitus, muscle power WNL B/L     Assessment:     Diabetic ulcer.     Plan:     Debride ulcer with # 15 blade.  Neosporin/DSD.  X ray left foot. Continue soaks and antibiotics.  RTC 2 weeks.  The xrays show no degeneration distal phalanx.  Her redness is not hot and she has been on antibiotics for 2 weeks now.  The redness do not appear to be related to the infection.  Will continue to monitor her toe.

## 2014-11-12 ENCOUNTER — Encounter: Payer: Self-pay | Admitting: Podiatry

## 2014-11-12 ENCOUNTER — Ambulatory Visit (INDEPENDENT_AMBULATORY_CARE_PROVIDER_SITE_OTHER): Payer: Medicare PPO | Admitting: Podiatry

## 2014-11-12 VITALS — BP 114/62 | HR 65 | Resp 18

## 2014-11-12 DIAGNOSIS — L89891 Pressure ulcer of other site, stage 1: Secondary | ICD-10-CM

## 2014-11-12 DIAGNOSIS — L97521 Non-pressure chronic ulcer of other part of left foot limited to breakdown of skin: Secondary | ICD-10-CM

## 2014-11-12 NOTE — Progress Notes (Signed)
Subjective:     Patient ID: Shannon [name], female   DOB: 1940-09-19, 74 y.o.   MRN: [id number]  HPIThis patient returns to the office with her  Reddened toe.  She says there is no drainage or pain associated with her toe.  She ambulates with surgical shoe.  There is improvement with decreased swelling in her third toe.   Review of Systems     Objective:   Physical Exam  GENERAL APPEARANCE: Alert, conversant. Appropriately groomed. No acute distress.  VASCULAR: Pedal pulses palpable at 2/4 DP and PT bilateral.  Capillary refill time is immediate to all digits,  Proximal to distal cooling it warm to warm.  Digital hair growth is present bilateral  NEUROLOGIC: sensation is intact epicritically and protectively to 5.07 monofilament at 5/5 sites bilateral.  Light touch is intact bilateral, vibratory sensation intact bilateral, achilles tendon reflex is intact bilateral.  MUSCULOSKELETAL: acceptable muscle strength, tone and stability bilateral.  Intrinsic muscluature intact bilateral.  Rectus appearance of foot and digits noted bilateral.   DERMATOLOGIC: skin color, texture, and turgor are within normal limits.  No preulcerative lesions or ulcers  are seen, no interdigital maceration noted.  No open lesions present.  Digital nails are asymptomatic. No drainage noted. Third toe is reddened but ulcer has healed with no infection.      Assessment:     S/p diabetic ulcer third toe left foot     Plan:     ROV.  Home instructions given.

## 2014-12-10 ENCOUNTER — Ambulatory Visit: Payer: Medicare PPO | Admitting: Podiatrist

## 2014-12-12 ENCOUNTER — Ambulatory Visit (INDEPENDENT_AMBULATORY_CARE_PROVIDER_SITE_OTHER): Payer: Medicare PPO | Admitting: Podiatry

## 2014-12-12 VITALS — BP 110/54 | HR 58 | Resp 16

## 2014-12-12 DIAGNOSIS — M79673 Pain in unspecified foot: Secondary | ICD-10-CM | POA: Diagnosis not present

## 2014-12-12 DIAGNOSIS — E1140 Type 2 diabetes mellitus with diabetic neuropathy, unspecified: Secondary | ICD-10-CM

## 2014-12-12 DIAGNOSIS — B351 Tinea unguium: Secondary | ICD-10-CM

## 2014-12-13 NOTE — Progress Notes (Signed)
Patient ID: Shannon [name], female   DOB: December 18, 1940, 74 y.o.   MRN: [id number] Complaint:  Visit Type: Patient returns to my office for continued preventative foot care services. Complaint: Patient states" my nails have grown long and thick and become painful to walk and wear shoes" Patient has been diagnosed with DM with no foot complications. The patient presents for preventative foot care services. No changes to ROS  Podiatric Exam: Vascular: dorsalis pedis  are palpable bilateral. Posterior tibial pulses are non palpable B/L.   Capillary return is immediate. Temperature gradient is WNL. Skin turgor WNL  Sensorium: Normal [employer] monofilament test. Normal tactile sensation bilaterally. Nail Exam: Pt has thick disfigured discolored nails with subungual debris noted bilateral entire nail hallux through fifth toenails Ulcer Exam: There is no evidence of ulcer or pre-ulcerative changes or infection. Orthopedic Exam: Muscle tone and strength are WNL. No limitations in general ROM. No crepitus or effusions noted. Foot type and digits show no abnormalities. 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 Skin: No Porokeratosis. No infection or ulcers.  Her purplish discolored toe has resolved and she has normal coloring third toe left foot  Diagnosis:  Onychomycosis, , Pain in right toe, pain in left toes  Treatment & Plan Procedures and Treatment: Consent by patient was obtained for treatment procedures. The patient understood the discussion of treatment and procedures well. All questions were answered thoroughly reviewed. Debridement of mycotic and hypertrophic toenails, 1 through 5 bilateral and clearing of subungual debris. No ulceration, no infection noted.  Return Visit-Office Procedure: Patient instructed to return to the office for a follow up visit 3 months for continued evaluation and treatment.

## 2014-12-14 ENCOUNTER — Encounter: Payer: Self-pay | Admitting: Podiatry

## 2015-01-02 ENCOUNTER — Ambulatory Visit: Payer: Medicare PPO | Admitting: Podiatry

## 2015-01-30 ENCOUNTER — Ambulatory Visit: Payer: Medicare PPO | Admitting: Sports Medicine

## 2015-02-14 ENCOUNTER — Ambulatory Visit (INDEPENDENT_AMBULATORY_CARE_PROVIDER_SITE_OTHER): Payer: Medicare PPO | Admitting: Sports Medicine

## 2015-02-14 ENCOUNTER — Encounter: Payer: Self-pay | Admitting: Sports Medicine

## 2015-02-14 DIAGNOSIS — E1140 Type 2 diabetes mellitus with diabetic neuropathy, unspecified: Secondary | ICD-10-CM | POA: Diagnosis not present

## 2015-02-14 DIAGNOSIS — M79672 Pain in left foot: Secondary | ICD-10-CM

## 2015-02-14 DIAGNOSIS — M79671 Pain in right foot: Secondary | ICD-10-CM

## 2015-02-14 DIAGNOSIS — B351 Tinea unguium: Secondary | ICD-10-CM | POA: Diagnosis not present

## 2015-02-14 DIAGNOSIS — Q828 Other specified congenital malformations of skin: Secondary | ICD-10-CM

## 2015-02-14 NOTE — Progress Notes (Signed)
Patient ID: Shannon [name], female   DOB: [birth year], 74 y.o.   MRN: [id number] Subjective: Shannon [name] is a 74 y.o. female patient with history of type 2 diabetes who presents to office today complaining of long, painful nails  while ambulating in shoes; unable to trim. Patient states that the glucose reading this morning was 119 mg/dl. Diagnosed with DM many years ago.Denies ETOH or Tobacco use. Patient denies any new changes in medication or new problems. Patient denies any new cramping, numbness, burning or tingling in the legs.  There are no active problems to display for this patient.  Current Outpatient Prescriptions on File Prior to Visit  Medication Sig Dispense Refill  . acetaminophen-codeine (TYLENOL #3) 300-30 MG per tablet     . ALPRAZolam (XANAX) 0.25 MG tablet     . amoxicillin (AMOXIL) 500 MG capsule Take 500 mg by mouth 3 (three) times daily.    Marland Kitchen atorvastatin (LIPITOR) 10 MG tablet Take 10 mg by mouth daily.    . bisacodyl (DULCOLAX) 5 MG EC tablet Take 5 mg by mouth daily as needed for moderate constipation.    . cadexomer iodine (IODOSORB) 0.9 % gel Apply 1 application topically daily as needed for wound care.    . cephALEXin (KEFLEX) 500 MG capsule Take 1 capsule (500 mg total) by mouth 3 (three) times daily. 30 capsule 0  . citalopram (CELEXA) 20 MG tablet Take 20 mg by mouth daily.    Marland Kitchen DEXILANT 60 MG capsule     . donepezil (ARICEPT) 10 MG tablet Take 10 mg by mouth at bedtime.    Marland Kitchen doxycycline (VIBRA-TABS) 100 MG tablet Take 1 tablet (100 mg total) by mouth 2 (two) times daily. 20 tablet 0  . Elastic Bandages & Supports (NEXCARE COBAN WRAP 3"K7QQ) MISC 1 application by Does not apply route daily. 1 each 3  . ergocalciferol (VITAMIN D2) 50000 UNITS capsule Take by mouth once a week.    . esomeprazole (NEXIUM) 20 MG capsule Take 20 mg by mouth daily at 12 noon.    Marland Kitchen estradiol (ESTRACE) 0.5 MG tablet     . fexofenadine (ALLEGRA) 60 MG tablet Take 60 mg by mouth 2 (two)  times daily.    . fluconazole (DIFLUCAN) 150 MG tablet Take 1 tablet (150 mg total) by mouth once. 1 tablet 0  . fluticasone (FLONASE) 50 MCG/ACT nasal spray Place into both nostrils daily.    . furosemide (LASIX) 20 MG tablet Take 20 mg by mouth.    . gabapentin (NEURONTIN) 600 MG tablet Take 600 mg by mouth 3 (three) times daily.    Marland Kitchen glipiZIDE (GLUCOTROL) 5 MG tablet Take by mouth daily before breakfast.    . glycopyrrolate (ROBINUL) 1 MG tablet Take 1 mg by mouth 3 (three) times daily.    . insulin glargine (LANTUS) 100 UNIT/ML injection Inject 100 Units into the skin at bedtime.    . INVOKANA 300 MG TABS     . KLOR-CON M20 20 MEQ tablet     . LANTUS SOLOSTAR 100 UNIT/ML Solostar Pen     . LORazepam (ATIVAN) 1 MG tablet Take 1 mg by mouth every 8 (eight) hours.    Marland Kitchen losartan-hydrochlorothiazide (HYZAAR) 100-25 MG per tablet Take 1 tablet by mouth daily.    Marland Kitchen lubiprostone (AMITIZA) 24 MCG capsule Take 24 mcg by mouth 2 (two) times daily with a meal.    . LYRICA 50 MG capsule     . metFORMIN (GLUCOPHAGE) 1000 MG  tablet Take 1,000 mg by mouth 2 (two) times daily with a meal.    . MYRBETRIQ 25 MG TB24 tablet     . nystatin (MYCOSTATIN/NYSTOP) 100000 UNIT/GM POWD     . nystatin cream (MYCOSTATIN) Apply 1 application topically 2 (two) times daily.    Marland Kitchen omeprazole (PRILOSEC) 20 MG capsule     . ONGLYZA 5 MG TABS tablet     . oxybutynin (DITROPAN XL) 15 MG 24 hr tablet Take 15 mg by mouth at bedtime.    . polyethylene glycol (MIRALAX / GLYCOLAX) packet Take 17 g by mouth daily.    . potassium chloride (K-DUR) 10 MEQ tablet Take 10 mEq by mouth daily.    . Saxagliptin-Metformin (KOMBIGLYZE XR) 2.08-998 MG TB24 Take by mouth 2 (two) times daily.    . silver sulfADIAZINE (SILVADENE) 1 % cream Apply 1 application topically daily. 50 g 0  . sodium chloride (OCEAN) 0.65 % nasal spray Place 1 spray into the nose as needed for congestion.    . sucralfate (CARAFATE) 1 G tablet Take 1 g by mouth 4  (four) times daily -  with meals and at bedtime.    . traMADol (ULTRAM) 50 MG tablet Take by mouth every 6 (six) hours as needed.    . Transdermal Patch PTCH by Does not apply route. Apply 1 patch once weekly and remove old patch before applying the new patch/lc    . traZODone (DESYREL) 100 MG tablet Take 100 mg by mouth at bedtime.    . traZODone (DESYREL) 50 MG tablet     . triamcinolone cream (KENALOG) 0.1 % Apply 1 application topically 2 (two) times daily.    . valACYclovir (VALTREX) 1000 MG tablet     . VESICARE 10 MG tablet      No current facility-administered medications on file prior to visit.   Allergies  Allergen Reactions  . Demerol [Meperidine]   . Peanut Butter Flavor   . Tape     Labs: HEMOGLOBIN A1C- No recent lab on file  Objective: General: Patient is awake, alert, and oriented x 3 and in no acute distress.  Integument: Skin is warm, dry and supple bilateral. Nails are tender, long, thickened and  dystrophic with subungual debris, consistent with onychomycosis, 1-5 bilateral. There is a small keratotic lesion present at the medial aspect of the left 2nd toe with no signs of infection. No open lesions present bilateral. Remaining integument unremarkable.  Vasculature:  Dorsalis Pedis pulse1/4 bilateral. Posterior Tibial pulse 1 /4 bilateral.  Capillary fill time <4 sec 1-5 bilateral. Scant hair growth to the level of the digits. Temperature gradient within normal limits.  + varicosities present bilateral. No edema present bilateral.   Neurology: The patient has absent sensation measured with a 5.07/10g Semmes Weinstein Monofilament at all pedal sites bilateral . Vibratory sensation diminished bilateral with tuning fork. No Babinski sign present bilateral.   Musculoskeletal: Bunion with crossover and hammertoes deformities noted bilateral. Muscular strength 5/5 in all lower extremity muscular groups bilateral without pain or limitation on range of motion . No  tenderness with calf compression bilateral.  Assessment and Plan: Problem List Items Addressed This Visit    None    Visit Diagnoses    Dermatophytosis of nail    -  Primary    Type 2 diabetes, controlled, with neuropathy (HCC)        Porokeratosis        Left 2nd toe    Foot pain, bilateral          -  Examined patient. -Discussed and educated patient on diabetic foot care, especially with  regards to the vascular, neurological and musculoskeletal systems.  -Stressed the importance of good glycemic control and the detriment of not  controlling glucose levels in relation to the foot. -Mechanically debrided all nails 1-5 bilateral using sterile nail nipper and filed with dremel without incident and debrided porokeratosis at medial aspect of left 2nd toe using sterile 15 blade without incident. Recommend toe spacer.  -Answered all patient questions -Patient to return as needed or in 3 months for at risk foot care -Patient advised to call the office if any problems or questions arise in the  Meantime.  Landis Martins, DPM

## 2015-05-22 ENCOUNTER — Encounter: Payer: Self-pay | Admitting: Sports Medicine

## 2015-05-22 ENCOUNTER — Ambulatory Visit (INDEPENDENT_AMBULATORY_CARE_PROVIDER_SITE_OTHER): Payer: Medicare Other | Admitting: Sports Medicine

## 2015-05-22 DIAGNOSIS — E1140 Type 2 diabetes mellitus with diabetic neuropathy, unspecified: Secondary | ICD-10-CM | POA: Diagnosis not present

## 2015-05-22 DIAGNOSIS — M79671 Pain in right foot: Secondary | ICD-10-CM

## 2015-05-22 DIAGNOSIS — B351 Tinea unguium: Secondary | ICD-10-CM | POA: Diagnosis not present

## 2015-05-22 DIAGNOSIS — M79672 Pain in left foot: Secondary | ICD-10-CM

## 2015-05-22 NOTE — Progress Notes (Signed)
Patient ID: Shannon [name], female   DOB: 04/22/1940, 75 y.o.   MRN: [id number]  Subjective: Shannon [name] is a 75 y.o. female patient with history of type 2 diabetes who presents to office today complaining of long, painful nails  while ambulating in shoes; unable to trim. Patient states that the glucose reading ranges from 85-120 mg/dl. Patient denies any new changes in medication or new problems. Patient denies any new cramping, numbness, burning or tingling in the legs.  Patient lives at [facility].  There are no active problems to display for this patient.  Current Outpatient Prescriptions on File Prior to Visit  Medication Sig Dispense Refill  . acetaminophen-codeine (TYLENOL #3) 300-30 MG per tablet     . ALPRAZolam (XANAX) 0.25 MG tablet     . amoxicillin (AMOXIL) 500 MG capsule Take 500 mg by mouth 3 (three) times daily.    Marland Kitchen atorvastatin (LIPITOR) 10 MG tablet Take 10 mg by mouth daily.    . bisacodyl (DULCOLAX) 5 MG EC tablet Take 5 mg by mouth daily as needed for moderate constipation.    . cadexomer iodine (IODOSORB) 0.9 % gel Apply 1 application topically daily as needed for wound care.    . cephALEXin (KEFLEX) 500 MG capsule Take 1 capsule (500 mg total) by mouth 3 (three) times daily. 30 capsule 0  . citalopram (CELEXA) 20 MG tablet Take 20 mg by mouth daily.    Marland Kitchen DEXILANT 60 MG capsule     . donepezil (ARICEPT) 10 MG tablet Take 10 mg by mouth at bedtime.    Marland Kitchen doxycycline (VIBRA-TABS) 100 MG tablet Take 1 tablet (100 mg total) by mouth 2 (two) times daily. 20 tablet 0  . Elastic Bandages & Supports (NEXCARE COBAN WRAP 123456) MISC 1 application by Does not apply route daily. 1 each 3  . ergocalciferol (VITAMIN D2) 50000 UNITS capsule Take by mouth once a week.    . esomeprazole (NEXIUM) 20 MG capsule Take 20 mg by mouth daily at 12 noon.    Marland Kitchen estradiol (ESTRACE) 0.5 MG tablet     . fexofenadine (ALLEGRA) 60 MG tablet Take 60 mg by mouth 2 (two) times  daily.    . fluconazole (DIFLUCAN) 150 MG tablet Take 1 tablet (150 mg total) by mouth once. 1 tablet 0  . fluticasone (FLONASE) 50 MCG/ACT nasal spray Place into both nostrils daily.    . furosemide (LASIX) 20 MG tablet Take 20 mg by mouth.    . gabapentin (NEURONTIN) 600 MG tablet Take 600 mg by mouth 3 (three) times daily.    Marland Kitchen glipiZIDE (GLUCOTROL) 5 MG tablet Take by mouth daily before breakfast.    . glycopyrrolate (ROBINUL) 1 MG tablet Take 1 mg by mouth 3 (three) times daily.    . insulin glargine (LANTUS) 100 UNIT/ML injection Inject 100 Units into the skin at bedtime.    . INVOKANA 300 MG TABS     . KLOR-CON M20 20 MEQ tablet     . LANTUS SOLOSTAR 100 UNIT/ML Solostar Pen     . LORazepam (ATIVAN) 1 MG tablet Take 1 mg by mouth every 8 (eight) hours.    Marland Kitchen losartan-hydrochlorothiazide (HYZAAR) 100-25 MG per tablet Take 1 tablet by mouth daily.    Marland Kitchen lubiprostone (AMITIZA) 24 MCG capsule Take 24 mcg by mouth 2 (two) times daily with a meal.    . LYRICA 50 MG capsule     . metFORMIN (GLUCOPHAGE) 1000 MG tablet Take  1,000 mg by mouth 2 (two) times daily with a meal.    . MYRBETRIQ 25 MG TB24 tablet     . nystatin (MYCOSTATIN/NYSTOP) 100000 UNIT/GM POWD     . nystatin cream (MYCOSTATIN) Apply 1 application topically 2 (two) times daily.    Marland Kitchen omeprazole (PRILOSEC) 20 MG capsule     . ONGLYZA 5 MG TABS tablet     . oxybutynin (DITROPAN XL) 15 MG 24 hr tablet Take 15 mg by mouth at bedtime.    . polyethylene glycol (MIRALAX / GLYCOLAX) packet Take 17 g by mouth daily.    . potassium chloride (K-DUR) 10 MEQ tablet Take 10 mEq by mouth daily.    . Saxagliptin-Metformin (KOMBIGLYZE XR) 2.08-998 MG TB24 Take by mouth 2 (two) times daily.    . silver sulfADIAZINE (SILVADENE) 1 % cream Apply 1 application topically daily. 50 g 0  . sodium chloride (OCEAN) 0.65 % nasal spray Place 1 spray into the nose as needed for congestion.    . sucralfate (CARAFATE) 1 G tablet Take 1 g by mouth 4 (four)  times daily -  with meals and at bedtime.    . traMADol (ULTRAM) 50 MG tablet Take by mouth every 6 (six) hours as needed.    . Transdermal Patch PTCH by Does not apply route. Apply 1 patch once weekly and remove old patch before applying the new patch/lc    . traZODone (DESYREL) 100 MG tablet Take 100 mg by mouth at bedtime.    . traZODone (DESYREL) 50 MG tablet     . triamcinolone cream (KENALOG) 0.1 % Apply 1 application topically 2 (two) times daily.    . valACYclovir (VALTREX) 1000 MG tablet     . VESICARE 10 MG tablet      No current facility-administered medications on file prior to visit.   Allergies  Allergen Reactions  . Demerol [Meperidine]   . Peanut Butter Flavor   . Tape     Labs: HEMOGLOBIN A1C- No recent lab on file  Objective: General: Patient is awake, alert, and oriented x 3 and in no acute distress.  Integument: Skin is warm, dry and supple bilateral. Nails are tender, long, thickened and  dystrophic with subungual debris, consistent with onychomycosis, 1-5 bilateral. Resolved keratotic lesion at the medial aspect of the left 2nd toe with no signs of infection/recurrence since last visit. No open lesions present bilateral. Remaining integument unremarkable.  Vasculature:  Dorsalis Pedis pulse1/4 bilateral. Posterior Tibial pulse 1 /4 bilateral.  Capillary fill time <4 sec 1-5 bilateral. Scant hair growth to the level of the digits. Temperature gradient within normal limits.  + varicosities present bilateral. No edema present bilateral.   Neurology: The patient has absent sensation measured with a 5.07/10g Semmes Weinstein Monofilament at all pedal sites bilateral . Vibratory sensation diminished bilateral with tuning fork. No Babinski sign present bilateral.   Musculoskeletal: Bunion with crossover and hammertoes deformities noted bilateral. Muscular strength 5/5 in all lower extremity muscular groups bilateral without pain or limitation on range of motion . No  tenderness with calf compression bilateral.  Assessment and Plan: Problem List Items Addressed This Visit    None    Visit Diagnoses    Dermatophytosis of nail    -  Primary    Type 2 diabetes, controlled, with neuropathy (HCC)        Foot pain, bilateral          -Examined patient. -Discussed and educated patient on diabetic foot  care, especially with  regards to the vascular, neurological and musculoskeletal systems.  -Stressed the importance of good glycemic control and the detriment of not  controlling glucose levels in relation to the foot. -Mechanically debrided all nails 1-5 bilateral using sterile nail nipper and filed with dremel without incident. -Cont with left 1st  Interspace toe spacer to prevent recurrence of keratotic lesion at medial 2nd toe  -Answered all patient questions -Patient to return in 3 months for at risk foot care -Patient advised to call the office if any problems or questions arise in the meantime.  Landis Martins, DPM

## 2015-08-21 ENCOUNTER — Ambulatory Visit (INDEPENDENT_AMBULATORY_CARE_PROVIDER_SITE_OTHER): Payer: Medicare Other | Admitting: Sports Medicine

## 2015-08-21 ENCOUNTER — Encounter: Payer: Self-pay | Admitting: Sports Medicine

## 2015-08-21 DIAGNOSIS — M79672 Pain in left foot: Secondary | ICD-10-CM

## 2015-08-21 DIAGNOSIS — E1140 Type 2 diabetes mellitus with diabetic neuropathy, unspecified: Secondary | ICD-10-CM

## 2015-08-21 DIAGNOSIS — M79671 Pain in right foot: Secondary | ICD-10-CM

## 2015-08-21 DIAGNOSIS — B351 Tinea unguium: Secondary | ICD-10-CM | POA: Diagnosis not present

## 2015-08-21 NOTE — Progress Notes (Signed)
Patient ID: Shannon [name], female   DOB: [birth year], 75 y.o.   MRN: [id number]  Subjective: Shannon [name] is a 75 y.o. female patient with history of type 2 diabetes who presents to office today complaining of long, painful nails  while ambulating in shoes; unable to trim. Patient states that the glucose reading ranges from 85-120 mg/dl, unchanged. Patient denies any new changes in medication or new problems. Patient denies any new cramping, numbness, burning or tingling in the legs.  Patient lives at [facility] assisted [facility].  There are no active problems to display for this patient.  Current Outpatient Prescriptions on File Prior to Visit  Medication Sig Dispense Refill  . acetaminophen-codeine (TYLENOL #3) 300-30 MG per tablet     . ALPRAZolam (XANAX) 0.25 MG tablet     . amoxicillin (AMOXIL) 500 MG capsule Take 500 mg by mouth 3 (three) times daily.    Marland Kitchen atorvastatin (LIPITOR) 10 MG tablet Take 10 mg by mouth daily.    . bisacodyl (DULCOLAX) 5 MG EC tablet Take 5 mg by mouth daily as needed for moderate constipation.    . cadexomer iodine (IODOSORB) 0.9 % gel Apply 1 application topically daily as needed for wound care.    . cephALEXin (KEFLEX) 500 MG capsule Take 1 capsule (500 mg total) by mouth 3 (three) times daily. 30 capsule 0  . citalopram (CELEXA) 20 MG tablet Take 20 mg by mouth daily.    Marland Kitchen DEXILANT 60 MG capsule     . donepezil (ARICEPT) 10 MG tablet Take 10 mg by mouth at bedtime.    Marland Kitchen doxycycline (VIBRA-TABS) 100 MG tablet Take 1 tablet (100 mg total) by mouth 2 (two) times daily. 20 tablet 0  . Elastic Bandages & Supports (NEXCARE COBAN WRAP 123456) MISC 1 application by Does not apply route daily. 1 each 3  . ergocalciferol (VITAMIN D2) 50000 UNITS capsule Take by mouth once a week.    . esomeprazole (NEXIUM) 20 MG capsule Take 20 mg by mouth daily at 12 noon.    Marland Kitchen estradiol (ESTRACE) 0.5 MG tablet     . fexofenadine (ALLEGRA) 60 MG tablet Take 60 mg by mouth 2  (two) times daily.    . fluconazole (DIFLUCAN) 150 MG tablet Take 1 tablet (150 mg total) by mouth once. 1 tablet 0  . fluticasone (FLONASE) 50 MCG/ACT nasal spray Place into both nostrils daily.    . furosemide (LASIX) 20 MG tablet Take 20 mg by mouth.    . gabapentin (NEURONTIN) 600 MG tablet Take 600 mg by mouth 3 (three) times daily.    Marland Kitchen glipiZIDE (GLUCOTROL) 5 MG tablet Take by mouth daily before breakfast.    . glycopyrrolate (ROBINUL) 1 MG tablet Take 1 mg by mouth 3 (three) times daily.    . insulin glargine (LANTUS) 100 UNIT/ML injection Inject 100 Units into the skin at bedtime.    . INVOKANA 300 MG TABS     . KLOR-CON M20 20 MEQ tablet     . LANTUS SOLOSTAR 100 UNIT/ML Solostar Pen     . LORazepam (ATIVAN) 1 MG tablet Take 1 mg by mouth every 8 (eight) hours.    Marland Kitchen losartan-hydrochlorothiazide (HYZAAR) 100-25 MG per tablet Take 1 tablet by mouth daily.    Marland Kitchen lubiprostone (AMITIZA) 24 MCG capsule Take 24 mcg by mouth 2 (two) times daily with a meal.    . LYRICA 50 MG capsule     . metFORMIN (GLUCOPHAGE) 1000 MG tablet  Take 1,000 mg by mouth 2 (two) times daily with a meal.    . MYRBETRIQ 25 MG TB24 tablet     . nystatin (MYCOSTATIN/NYSTOP) 100000 UNIT/GM POWD     . nystatin cream (MYCOSTATIN) Apply 1 application topically 2 (two) times daily.    Marland Kitchen omeprazole (PRILOSEC) 20 MG capsule     . ONGLYZA 5 MG TABS tablet     . oxybutynin (DITROPAN XL) 15 MG 24 hr tablet Take 15 mg by mouth at bedtime.    . polyethylene glycol (MIRALAX / GLYCOLAX) packet Take 17 g by mouth daily.    . potassium chloride (K-DUR) 10 MEQ tablet Take 10 mEq by mouth daily.    . Saxagliptin-Metformin (KOMBIGLYZE XR) 2.08-998 MG TB24 Take by mouth 2 (two) times daily.    . silver sulfADIAZINE (SILVADENE) 1 % cream Apply 1 application topically daily. 50 g 0  . sodium chloride (OCEAN) 0.65 % nasal spray Place 1 spray into the nose as needed for congestion.    . sucralfate (CARAFATE) 1 G tablet Take 1 g by mouth 4  (four) times daily -  with meals and at bedtime.    . traMADol (ULTRAM) 50 MG tablet Take by mouth every 6 (six) hours as needed.    . Transdermal Patch PTCH by Does not apply route. Apply 1 patch once weekly and remove old patch before applying the new patch/lc    . traZODone (DESYREL) 100 MG tablet Take 100 mg by mouth at bedtime.    . traZODone (DESYREL) 50 MG tablet     . triamcinolone cream (KENALOG) 0.1 % Apply 1 application topically 2 (two) times daily.    . valACYclovir (VALTREX) 1000 MG tablet     . VESICARE 10 MG tablet      No current facility-administered medications on file prior to visit.   Allergies  Allergen Reactions  . Demerol [Meperidine]   . Peanut Butter Flavor   . Tape     Labs: HEMOGLOBIN A1C- No recent lab on file  Objective: General: Patient is awake, alert, and oriented x 3 and in no acute distress.  Integument: Skin is warm, dry and supple bilateral. Nails are tender, long, thickened and  dystrophic with subungual debris, consistent with onychomycosis, 1-5 bilateral. No open lesions present bilateral. Old surgical scars Remaining integument unremarkable.  Vasculature:  Dorsalis Pedis pulse1/4 bilateral. Posterior Tibial pulse 1 /4 bilateral.  Capillary fill time <4 sec 1-5 bilateral. Scant hair growth to the level of the digits. Temperature gradient within normal limits.  + varicosities present bilateral. No edema present bilateral.   Neurology: The patient has absent sensation measured with a 5.07/10g Semmes Weinstein Monofilament at all pedal sites bilateral . Vibratory sensation diminished bilateral with tuning fork. No Babinski sign present bilateral.   Musculoskeletal: Bunion with crossover and hammertoes deformities noted bilateral, midfoot exostosis on right. Muscular strength 5/5 in all lower extremity muscular groups bilateral without pain or limitation on range of motion . No tenderness with calf compression bilateral.  Assessment and  Plan: Problem List Items Addressed This Visit    None    Visit Diagnoses    Dermatophytosis of nail    -  Primary    Type 2 diabetes, controlled, with neuropathy (HCC)        Foot pain, bilateral          -Examined patient. -Discussed and educated patient on diabetic foot care, especially with  regards to the vascular, neurological and musculoskeletal systems.  -  Stressed the importance of good glycemic control and the detriment of not  controlling glucose levels in relation to the foot. -Mechanically debrided all nails 1-5 bilateral using sterile nail nipper and filed with dremel without incident. -Answered all patient questions -Patient to return in 3 months for at risk foot care -Patient advised to call the office if any problems or questions arise in the meantime.  Landis Martins, DPM

## 2015-11-20 ENCOUNTER — Ambulatory Visit: Payer: Medicare Other | Admitting: Sports Medicine

## 2015-11-27 ENCOUNTER — Encounter: Payer: Self-pay | Admitting: Sports Medicine

## 2015-11-27 ENCOUNTER — Ambulatory Visit (INDEPENDENT_AMBULATORY_CARE_PROVIDER_SITE_OTHER): Payer: Medicare Other | Admitting: Sports Medicine

## 2015-11-27 DIAGNOSIS — E1140 Type 2 diabetes mellitus with diabetic neuropathy, unspecified: Secondary | ICD-10-CM | POA: Diagnosis not present

## 2015-11-27 DIAGNOSIS — M79671 Pain in right foot: Secondary | ICD-10-CM | POA: Diagnosis not present

## 2015-11-27 DIAGNOSIS — B351 Tinea unguium: Secondary | ICD-10-CM | POA: Diagnosis not present

## 2015-11-27 DIAGNOSIS — Q828 Other specified congenital malformations of skin: Secondary | ICD-10-CM | POA: Diagnosis not present

## 2015-11-27 DIAGNOSIS — M79672 Pain in left foot: Secondary | ICD-10-CM

## 2015-11-27 NOTE — Progress Notes (Signed)
Patient ID: Shannon [name], female   DOB: 04-May-1940, 75 y.o.   MRN: [id number]  Subjective: Shannon [name] is a 75 y.o. female patient with history of type 2 diabetes who returns to office today complaining of long, painful nails  while ambulating in shoes; unable to trim. Patient states that the glucose reading ranges from 85-120 mg/dl, unchanged. Patient denies any new changes in medication or new problems. Patient denies any new cramping, numbness, burning or tingling in the legs.  Patient lives at [facility].  There are no active problems to display for this patient.  Current Outpatient Prescriptions on File Prior to Visit  Medication Sig Dispense Refill  . acetaminophen-codeine (TYLENOL #3) 300-30 MG per tablet     . ALPRAZolam (XANAX) 0.25 MG tablet     . amoxicillin (AMOXIL) 500 MG capsule Take 500 mg by mouth 3 (three) times daily.    Marland Kitchen atorvastatin (LIPITOR) 10 MG tablet Take 10 mg by mouth daily.    . bisacodyl (DULCOLAX) 5 MG EC tablet Take 5 mg by mouth daily as needed for moderate constipation.    . cadexomer iodine (IODOSORB) 0.9 % gel Apply 1 application topically daily as needed for wound care.    . cephALEXin (KEFLEX) 500 MG capsule Take 1 capsule (500 mg total) by mouth 3 (three) times daily. 30 capsule 0  . citalopram (CELEXA) 20 MG tablet Take 20 mg by mouth daily.    Marland Kitchen DEXILANT 60 MG capsule     . donepezil (ARICEPT) 10 MG tablet Take 10 mg by mouth at bedtime.    Marland Kitchen doxycycline (VIBRA-TABS) 100 MG tablet Take 1 tablet (100 mg total) by mouth 2 (two) times daily. 20 tablet 0  . Elastic Bandages & Supports (NEXCARE COBAN WRAP 123456) MISC 1 application by Does not apply route daily. 1 each 3  . ergocalciferol (VITAMIN D2) 50000 UNITS capsule Take by mouth once a week.    . esomeprazole (NEXIUM) 20 MG capsule Take 20 mg by mouth daily at 12 noon.    Marland Kitchen estradiol (ESTRACE) 0.5 MG tablet     . fexofenadine (ALLEGRA) 60 MG tablet Take 60 mg by mouth 2  (two) times daily.    . fluconazole (DIFLUCAN) 150 MG tablet Take 1 tablet (150 mg total) by mouth once. 1 tablet 0  . fluticasone (FLONASE) 50 MCG/ACT nasal spray Place into both nostrils daily.    . furosemide (LASIX) 20 MG tablet Take 20 mg by mouth.    . gabapentin (NEURONTIN) 600 MG tablet Take 600 mg by mouth 3 (three) times daily.    Marland Kitchen glipiZIDE (GLUCOTROL) 5 MG tablet Take by mouth daily before breakfast.    . glycopyrrolate (ROBINUL) 1 MG tablet Take 1 mg by mouth 3 (three) times daily.    . insulin glargine (LANTUS) 100 UNIT/ML injection Inject 100 Units into the skin at bedtime.    . INVOKANA 300 MG TABS     . KLOR-CON M20 20 MEQ tablet     . LANTUS SOLOSTAR 100 UNIT/ML Solostar Pen     . LORazepam (ATIVAN) 1 MG tablet Take 1 mg by mouth every 8 (eight) hours.    Marland Kitchen losartan-hydrochlorothiazide (HYZAAR) 100-25 MG per tablet Take 1 tablet by mouth daily.    Marland Kitchen lubiprostone (AMITIZA) 24 MCG capsule Take 24 mcg by mouth 2 (two) times daily with a meal.    . LYRICA 50 MG capsule     . metFORMIN (GLUCOPHAGE) 1000 MG tablet  Take 1,000 mg by mouth 2 (two) times daily with a meal.    . MYRBETRIQ 25 MG TB24 tablet     . nystatin (MYCOSTATIN/NYSTOP) 100000 UNIT/GM POWD     . nystatin cream (MYCOSTATIN) Apply 1 application topically 2 (two) times daily.    Marland Kitchen omeprazole (PRILOSEC) 20 MG capsule     . ONGLYZA 5 MG TABS tablet     . oxybutynin (DITROPAN XL) 15 MG 24 hr tablet Take 15 mg by mouth at bedtime.    . polyethylene glycol (MIRALAX / GLYCOLAX) packet Take 17 g by mouth daily.    . potassium chloride (K-DUR) 10 MEQ tablet Take 10 mEq by mouth daily.    . Saxagliptin-Metformin (KOMBIGLYZE XR) 2.08-998 MG TB24 Take by mouth 2 (two) times daily.    . silver sulfADIAZINE (SILVADENE) 1 % cream Apply 1 application topically daily. 50 g 0  . sodium chloride (OCEAN) 0.65 % nasal spray Place 1 spray into the nose as needed for congestion.    . sucralfate (CARAFATE) 1 G tablet Take 1 g by mouth 4  (four) times daily -  with meals and at bedtime.    . traMADol (ULTRAM) 50 MG tablet Take by mouth every 6 (six) hours as needed.    . Transdermal Patch PTCH by Does not apply route. Apply 1 patch once weekly and remove old patch before applying the new patch/lc    . traZODone (DESYREL) 100 MG tablet Take 100 mg by mouth at bedtime.    . traZODone (DESYREL) 50 MG tablet     . triamcinolone cream (KENALOG) 0.1 % Apply 1 application topically 2 (two) times daily.    . valACYclovir (VALTREX) 1000 MG tablet     . VESICARE 10 MG tablet      No current facility-administered medications on file prior to visit.    Allergies  Allergen Reactions  . Demerol [Meperidine]   . Peanut Butter Flavor   . Tape     Labs: HEMOGLOBIN A1C- No recent lab on file  Objective: General: Patient is awake, alert, and oriented x 3 and in no acute distress.  Integument: Skin is warm, dry and supple bilateral. Nails are tender, long, thickened and  dystrophic with subungual debris, consistent with onychomycosis, 1-5 bilateral. + corn at Left 3rd toe lateral aspect. No open lesions present bilateral. Old surgical scars Remaining integument unremarkable.  Vasculature:  Dorsalis Pedis pulse1/4 bilateral. Posterior Tibial pulse 1 /4 bilateral.  Capillary fill time <4 sec 1-5 bilateral. Scant hair growth to the level of the digits. Temperature gradient within normal limits.  + varicosities present bilateral. No edema present bilateral.   Neurology: The patient has absent sensation measured with a 5.07/10g Semmes Weinstein Monofilament at all pedal sites bilateral . Vibratory sensation diminished bilateral with tuning fork. No Babinski sign present bilateral.   Musculoskeletal: Bunion with crossover and hammertoes deformities noted bilateral, midfoot exostosis on right. Muscular strength 5/5 in all lower extremity muscular groups bilateral without pain or limitation on range of motion . No tenderness with calf  compression bilateral.  Assessment and Plan: Problem List Items Addressed This Visit    None    Visit Diagnoses    Dermatophytosis of nail    -  Primary   Type 2 diabetes, controlled, with neuropathy (HCC)       Foot pain, bilateral       Porokeratosis          -Examined patient. -Discussed and educated patient on  diabetic foot care, especially with  regards to the vascular, neurological and musculoskeletal systems.  -Stressed the importance of good glycemic control and the detriment of not  controlling glucose levels in relation to the foot. -Mechanically debrided all nails 1-5 bilateral using sterile nail nipper and filed with dremel without incident. -Debrided corn at left 3rd toe using sterile chisel blade without incident. -Good supportive shoes for foot type -Answered all patient questions -Patient to return in 3 months for at risk foot care -Patient advised to call the office if any problems or questions arise in the meantime.  Landis Martins, DPM

## 2016-02-26 ENCOUNTER — Ambulatory Visit: Payer: Medicare Other | Admitting: Sports Medicine

## 2016-08-06 ENCOUNTER — Ambulatory Visit (INDEPENDENT_AMBULATORY_CARE_PROVIDER_SITE_OTHER): Payer: Medicare Other | Admitting: Sports Medicine

## 2016-08-06 ENCOUNTER — Encounter: Payer: Self-pay | Admitting: Sports Medicine

## 2016-08-06 VITALS — BP 131/66 | HR 74 | Resp 18

## 2016-08-06 DIAGNOSIS — M79671 Pain in right foot: Secondary | ICD-10-CM

## 2016-08-06 DIAGNOSIS — B351 Tinea unguium: Secondary | ICD-10-CM | POA: Diagnosis not present

## 2016-08-06 DIAGNOSIS — M79672 Pain in left foot: Secondary | ICD-10-CM

## 2016-08-06 DIAGNOSIS — E1140 Type 2 diabetes mellitus with diabetic neuropathy, unspecified: Secondary | ICD-10-CM

## 2016-08-06 NOTE — Progress Notes (Signed)
Patient ID: Shannon [name], female   DOB: November 10, 1940, 76 y.o.   MRN: [id number]  Subjective: Shannon [name] is a 76 y.o. female patient with history of type 2 diabetes who returns to office today complaining of long, painful nails  while ambulating in shoes; unable to trim. Patient states that the glucose readings have been the same.Patient denies any new changes in medication or new problems. Patient denies any new cramping, numbness, burning or tingling in the legs.  Patient lives at [facility] facility.  There are no active problems to display for this patient.  Current Outpatient Prescriptions on File Prior to Visit  Medication Sig Dispense Refill  . acetaminophen-codeine (TYLENOL #3) 300-30 MG per tablet     . ALPRAZolam (XANAX) 0.25 MG tablet     . amoxicillin (AMOXIL) 500 MG capsule Take 500 mg by mouth 3 (three) times daily.    Marland Kitchen atorvastatin (LIPITOR) 10 MG tablet Take 10 mg by mouth daily.    . bisacodyl (DULCOLAX) 5 MG EC tablet Take 5 mg by mouth daily as needed for moderate constipation.    . cadexomer iodine (IODOSORB) 0.9 % gel Apply 1 application topically daily as needed for wound care.    . cephALEXin (KEFLEX) 500 MG capsule Take 1 capsule (500 mg total) by mouth 3 (three) times daily. 30 capsule 0  . citalopram (CELEXA) 20 MG tablet Take 20 mg by mouth daily.    Marland Kitchen DEXILANT 60 MG capsule     . donepezil (ARICEPT) 10 MG tablet Take 10 mg by mouth at bedtime.    Marland Kitchen doxycycline (VIBRA-TABS) 100 MG tablet Take 1 tablet (100 mg total) by mouth 2 (two) times daily. 20 tablet 0  . Elastic Bandages & Supports (NEXCARE COBAN WRAP 9"[id number]) MISC 1 application by Does not apply route daily. 1 each 3  . ergocalciferol (VITAMIN D2) 50000 UNITS capsule Take by mouth once a week.    . esomeprazole (NEXIUM) 20 MG capsule Take 20 mg by mouth daily at 12 noon.    Marland Kitchen estradiol (ESTRACE) 0.5 MG tablet     . fexofenadine (ALLEGRA) 60 MG tablet Take 60 mg by mouth 2 (two)  times daily.    . fluconazole (DIFLUCAN) 150 MG tablet Take 1 tablet (150 mg total) by mouth once. 1 tablet 0  . fluticasone (FLONASE) 50 MCG/ACT nasal spray Place into both nostrils daily.    . furosemide (LASIX) 20 MG tablet Take 20 mg by mouth.    . gabapentin (NEURONTIN) 600 MG tablet Take 600 mg by mouth 3 (three) times daily.    Marland Kitchen glipiZIDE (GLUCOTROL) 5 MG tablet Take by mouth daily before breakfast.    . glycopyrrolate (ROBINUL) 1 MG tablet Take 1 mg by mouth 3 (three) times daily.    . insulin glargine (LANTUS) 100 UNIT/ML injection Inject 100 Units into the skin at bedtime.    . INVOKANA 300 MG TABS     . KLOR-CON M20 20 MEQ tablet     . LANTUS SOLOSTAR 100 UNIT/ML Solostar Pen     . LORazepam (ATIVAN) 1 MG tablet Take 1 mg by mouth every 8 (eight) hours.    Marland Kitchen losartan-hydrochlorothiazide (HYZAAR) 100-25 MG per tablet Take 1 tablet by mouth daily.    Marland Kitchen lubiprostone (AMITIZA) 24 MCG capsule Take 24 mcg by mouth 2 (two) times daily with a meal.    . LYRICA 50 MG capsule     . metFORMIN (GLUCOPHAGE) 1000 MG tablet Take  1,000 mg by mouth 2 (two) times daily with a meal.    . MYRBETRIQ 25 MG TB24 tablet     . nystatin (MYCOSTATIN/NYSTOP) 100000 UNIT/GM POWD     . nystatin cream (MYCOSTATIN) Apply 1 application topically 2 (two) times daily.    Marland Kitchen omeprazole (PRILOSEC) 20 MG capsule     . ONGLYZA 5 MG TABS tablet     . oxybutynin (DITROPAN XL) 15 MG 24 hr tablet Take 15 mg by mouth at bedtime.    . polyethylene glycol (MIRALAX / GLYCOLAX) packet Take 17 g by mouth daily.    . potassium chloride (K-DUR) 10 MEQ tablet Take 10 mEq by mouth daily.    . Saxagliptin-Metformin (KOMBIGLYZE XR) 2.08-998 MG TB24 Take by mouth 2 (two) times daily.    . silver sulfADIAZINE (SILVADENE) 1 % cream Apply 1 application topically daily. 50 g 0  . sodium chloride (OCEAN) 0.65 % nasal spray Place 1 spray into the nose as needed for congestion.    . sucralfate (CARAFATE) 1 G tablet Take 1 g by mouth 4  (four) times daily -  with meals and at bedtime.    . traMADol (ULTRAM) 50 MG tablet Take by mouth every 6 (six) hours as needed.    . Transdermal Patch PTCH by Does not apply route. Apply 1 patch once weekly and remove old patch before applying the new patch/lc    . traZODone (DESYREL) 100 MG tablet Take 100 mg by mouth at bedtime.    . traZODone (DESYREL) 50 MG tablet     . triamcinolone cream (KENALOG) 0.1 % Apply 1 application topically 2 (two) times daily.    . valACYclovir (VALTREX) 1000 MG tablet     . VESICARE 10 MG tablet      No current facility-administered medications on file prior to visit.    Allergies  Allergen Reactions  . Demerol [Meperidine]   . Peanut Butter Flavor   . Tape     Labs: HEMOGLOBIN A1C- No recent lab on file  Objective: General: Patient is awake, alert, and oriented x 3 and in no acute distress.  Integument: Skin is warm, dry and supple bilateral. Nails are tender, long, thickened and dystrophic with subungual debris, consistent with onychomycosis, 1-5 bilateral. + minimal corn at Left 3rd toe lateral aspect. No open lesions present bilateral. Old surgical scars Remaining integument unremarkable.  Vasculature:  Dorsalis Pedis pulse1/4 bilateral. Posterior Tibial pulse 1 /4 bilateral.  Capillary fill time <4 sec 1-5 bilateral. Scant hair growth to the level of the digits. Temperature gradient within normal limits.  + varicosities present bilateral. No edema present bilateral.   Neurology: The patient has absent sensation measured with a 5.07/10g Semmes Weinstein Monofilament at all pedal sites bilateral . Vibratory sensation diminished bilateral with tuning fork. No Babinski sign present bilateral.   Musculoskeletal: Bunion with crossover and hammertoes deformities noted bilateral, midfoot exostosis on right. Muscular strength 5/5 in all lower extremity muscular groups bilateral without pain or limitation on range of motion . No tenderness with calf  compression bilateral.  Assessment and Plan: Problem List Items Addressed This Visit    None    Visit Diagnoses    Dermatophytosis of nail    -  Primary   Type 2 diabetes, controlled, with neuropathy (HCC)       Foot pain, bilateral          -Examined patient. -Discussed and educated patient on diabetic foot care, especially with  regards to  the vascular, neurological and musculoskeletal systems.  -Stressed the importance of good glycemic control and the detriment of not  controlling glucose levels in relation to the foot. -Mechanically debrided all nails 1-5 bilateral using sterile nail nipper and filed with dremel without incident. -Good supportive shoes for foot type -Answered all patient questions -Patient to return in 3 months for at risk foot care -Patient advised to call the office if any problems or questions arise in the meantime.  Landis Martins, DPM

## 2016-10-15 DIAGNOSIS — F32A Depression, unspecified: Secondary | ICD-10-CM | POA: Insufficient documentation

## 2016-10-15 DIAGNOSIS — F329 Major depressive disorder, single episode, unspecified: Secondary | ICD-10-CM | POA: Insufficient documentation

## 2016-11-05 ENCOUNTER — Ambulatory Visit (INDEPENDENT_AMBULATORY_CARE_PROVIDER_SITE_OTHER): Payer: Medicare Other | Admitting: Sports Medicine

## 2016-11-05 DIAGNOSIS — M79672 Pain in left foot: Secondary | ICD-10-CM

## 2016-11-05 DIAGNOSIS — B351 Tinea unguium: Secondary | ICD-10-CM

## 2016-11-05 DIAGNOSIS — E1140 Type 2 diabetes mellitus with diabetic neuropathy, unspecified: Secondary | ICD-10-CM

## 2016-11-05 DIAGNOSIS — M79671 Pain in right foot: Secondary | ICD-10-CM | POA: Diagnosis not present

## 2016-11-06 NOTE — Progress Notes (Addendum)
Patient ID: Shannon [name], female   DOB: 1940-05-09, 76 y.o.   MRN: [id number]  Subjective: Shannon [name] is a 76 y.o. female patient with history of type 2 diabetes who returns to office today complaining of long, painful nails while ambulating in shoes; unable to trim. Patient states that her nails have grown out long this time. Patient states that the glucose readings have been the same.Patient denies any new changes in medication or new problems.   Patient lives at [facility].  There are no active problems to display for this patient.  Current Outpatient Prescriptions on File Prior to Visit  Medication Sig Dispense Refill  . acetaminophen-codeine (TYLENOL #3) 300-30 MG per tablet     . ALPRAZolam (XANAX) 0.25 MG tablet     . amoxicillin (AMOXIL) 500 MG capsule Take 500 mg by mouth 3 (three) times daily.    Marland Kitchen atorvastatin (LIPITOR) 10 MG tablet Take 10 mg by mouth daily.    . bisacodyl (DULCOLAX) 5 MG EC tablet Take 5 mg by mouth daily as needed for moderate constipation.    . cadexomer iodine (IODOSORB) 0.9 % gel Apply 1 application topically daily as needed for wound care.    . cephALEXin (KEFLEX) 500 MG capsule Take 1 capsule (500 mg total) by mouth 3 (three) times daily. 30 capsule 0  . citalopram (CELEXA) 20 MG tablet Take 20 mg by mouth daily.    Marland Kitchen DEXILANT 60 MG capsule     . donepezil (ARICEPT) 10 MG tablet Take 10 mg by mouth at bedtime.    Marland Kitchen doxycycline (VIBRA-TABS) 100 MG tablet Take 1 tablet (100 mg total) by mouth 2 (two) times daily. 20 tablet 0  . Elastic Bandages & Supports (NEXCARE COBAN WRAP 7"C6CB) MISC 1 application by Does not apply route daily. 1 each 3  . ergocalciferol (VITAMIN D2) 50000 UNITS capsule Take by mouth once a week.    . esomeprazole (NEXIUM) 20 MG capsule Take 20 mg by mouth daily at 12 noon.    Marland Kitchen estradiol (ESTRACE) 0.5 MG tablet     . fexofenadine (ALLEGRA) 60 MG tablet Take 60 mg by mouth 2 (two) times daily.    .  fluconazole (DIFLUCAN) 150 MG tablet Take 1 tablet (150 mg total) by mouth once. 1 tablet 0  . fluticasone (FLONASE) 50 MCG/ACT nasal spray Place into both nostrils daily.    . furosemide (LASIX) 20 MG tablet Take 20 mg by mouth.    . gabapentin (NEURONTIN) 600 MG tablet Take 600 mg by mouth 3 (three) times daily.    Marland Kitchen glipiZIDE (GLUCOTROL) 5 MG tablet Take by mouth daily before breakfast.    . glycopyrrolate (ROBINUL) 1 MG tablet Take 1 mg by mouth 3 (three) times daily.    . insulin glargine (LANTUS) 100 UNIT/ML injection Inject 100 Units into the skin at bedtime.    . INVOKANA 300 MG TABS     . KLOR-CON M20 20 MEQ tablet     . LANTUS SOLOSTAR 100 UNIT/ML Solostar Pen     . LORazepam (ATIVAN) 1 MG tablet Take 1 mg by mouth every 8 (eight) hours.    Marland Kitchen losartan-hydrochlorothiazide (HYZAAR) 100-25 MG per tablet Take 1 tablet by mouth daily.    Marland Kitchen lubiprostone (AMITIZA) 24 MCG capsule Take 24 mcg by mouth 2 (two) times daily with a meal.    . LYRICA 50 MG capsule     . metFORMIN (GLUCOPHAGE) 1000 MG tablet Take 1,000  mg by mouth 2 (two) times daily with a meal.    . MYRBETRIQ 25 MG TB24 tablet     . nystatin (MYCOSTATIN/NYSTOP) 100000 UNIT/GM POWD     . nystatin cream (MYCOSTATIN) Apply 1 application topically 2 (two) times daily.    Marland Kitchen omeprazole (PRILOSEC) 20 MG capsule     . ONGLYZA 5 MG TABS tablet     . oxybutynin (DITROPAN XL) 15 MG 24 hr tablet Take 15 mg by mouth at bedtime.    . polyethylene glycol (MIRALAX / GLYCOLAX) packet Take 17 g by mouth daily.    . potassium chloride (K-DUR) 10 MEQ tablet Take 10 mEq by mouth daily.    . Saxagliptin-Metformin (KOMBIGLYZE XR) 2.08-998 MG TB24 Take by mouth 2 (two) times daily.    . silver sulfADIAZINE (SILVADENE) 1 % cream Apply 1 application topically daily. 50 g 0  . sodium chloride (OCEAN) 0.65 % nasal spray Place 1 spray into the nose as needed for congestion.    . sucralfate (CARAFATE) 1 G tablet Take 1 g by mouth 4 (four) times daily -   with meals and at bedtime.    . traMADol (ULTRAM) 50 MG tablet Take by mouth every 6 (six) hours as needed.    . Transdermal Patch PTCH by Does not apply route. Apply 1 patch once weekly and remove old patch before applying the new patch/lc    . traZODone (DESYREL) 100 MG tablet Take 100 mg by mouth at bedtime.    . traZODone (DESYREL) 50 MG tablet     . triamcinolone cream (KENALOG) 0.1 % Apply 1 application topically 2 (two) times daily.    . valACYclovir (VALTREX) 1000 MG tablet     . VESICARE 10 MG tablet      No current facility-administered medications on file prior to visit.    Allergies  Allergen Reactions  . Demerol [Meperidine]   . Peanut Butter Flavor   . Tape     Labs: HEMOGLOBIN A1C- No recent lab on file  Objective: General: Patient is awake, alert, and oriented x 3 and in no acute distress.  Integument: Skin is warm, dry and supple bilateral. Nails are tender, long, thickened and dystrophic with subungual debris, consistent with onychomycosis, 1-5 bilateral. + Very minimal corn at Left 3rd toe lateral aspect. No open lesions present bilateral. Old surgical scars Remaining integument unremarkable.  Vasculature:  Dorsalis Pedis pulse1/4 bilateral. Posterior Tibial pulse 1 /4 bilateral.  Capillary fill time <4 sec 1-5 bilateral. Scant hair growth to the level of the digits. Temperature gradient within normal limits.  + varicosities present bilateral. No edema present bilateral.   Neurology: The patient has absent sensation measured with a 5.07/10g Semmes Weinstein Monofilament at all pedal sites bilateral . Vibratory sensation diminished bilateral with tuning fork. No Babinski sign present bilateral.   Musculoskeletal: Bunion with crossover and hammertoes deformities noted bilateral, midfoot exostosis on right. Muscular strength 5/5 in all lower extremity muscular groups bilateral without pain or limitation on range of motion . No tenderness with calf compression  bilateral.  Assessment and Plan: Problem List Items Addressed This Visit    None    Visit Diagnoses    Dermatophytosis of nail    -  Primary   Type 2 diabetes, controlled, with neuropathy (HCC)       Foot pain, bilateral          -Examined patient. -Discussed and educated patient on diabetic foot care, especially with  regards to  the vascular, neurological and musculoskeletal systems.  -Stressed the importance of good glycemic control and the detriment of not  controlling glucose levels in relation to the foot. -Mechanically debrided all nails 1-5 bilateral using sterile nail nipper and filed with dremel without incident. -Recommend continue with Good supportive shoes for foot type and use of diabetic shoes -Answered all patient questions -Patient to return in 2.5 months for at risk foot care -Patient advised to call the office if any problems or questions arise in the meantime.  Landis Martins, DPM

## 2017-01-22 ENCOUNTER — Ambulatory Visit: Payer: Medicare Other | Admitting: Sports Medicine

## 2017-02-03 ENCOUNTER — Ambulatory Visit (INDEPENDENT_AMBULATORY_CARE_PROVIDER_SITE_OTHER): Payer: Medicare Other | Admitting: Sports Medicine

## 2017-02-03 ENCOUNTER — Encounter: Payer: Self-pay | Admitting: Sports Medicine

## 2017-02-03 DIAGNOSIS — M79671 Pain in right foot: Secondary | ICD-10-CM | POA: Diagnosis not present

## 2017-02-03 DIAGNOSIS — E1140 Type 2 diabetes mellitus with diabetic neuropathy, unspecified: Secondary | ICD-10-CM

## 2017-02-03 DIAGNOSIS — B351 Tinea unguium: Secondary | ICD-10-CM

## 2017-02-03 DIAGNOSIS — M79672 Pain in left foot: Secondary | ICD-10-CM | POA: Diagnosis not present

## 2017-02-03 NOTE — Progress Notes (Signed)
Patient ID: Shannon [name], female   DOB: 23-Jul-1940, 76 y.o.   MRN: [id number]  Subjective: Shannon [name] is a 76 y.o. female patient with history of type 2 diabetes who returns to office today complaining of long, painful nails while ambulating in shoes; unable to trim. Patient states that the glucose readings have been the same, 150, last A1c 7.Patient denies any new changes in medication or new problems.   Patient lives at [facility].  There are no active problems to display for this patient.  Current Outpatient Prescriptions on File Prior to Visit  Medication Sig Dispense Refill  . acetaminophen-codeine (TYLENOL #3) 300-30 MG per tablet     . ALPRAZolam (XANAX) 0.25 MG tablet     . amoxicillin (AMOXIL) 500 MG capsule Take 500 mg by mouth 3 (three) times daily.    Marland Kitchen atorvastatin (LIPITOR) 10 MG tablet Take 10 mg by mouth daily.    . bisacodyl (DULCOLAX) 5 MG EC tablet Take 5 mg by mouth daily as needed for moderate constipation.    . cadexomer iodine (IODOSORB) 0.9 % gel Apply 1 application topically daily as needed for wound care.    . cephALEXin (KEFLEX) 500 MG capsule Take 1 capsule (500 mg total) by mouth 3 (three) times daily. 30 capsule 0  . citalopram (CELEXA) 20 MG tablet Take 20 mg by mouth daily.    Marland Kitchen DEXILANT 60 MG capsule     . donepezil (ARICEPT) 10 MG tablet Take 10 mg by mouth at bedtime.    Marland Kitchen doxycycline (VIBRA-TABS) 100 MG tablet Take 1 tablet (100 mg total) by mouth 2 (two) times daily. 20 tablet 0  . Elastic Bandages & Supports (NEXCARE COBAN WRAP 4"U9WJ) MISC 1 application by Does not apply route daily. 1 each 3  . ergocalciferol (VITAMIN D2) 50000 UNITS capsule Take by mouth once a week.    . esomeprazole (NEXIUM) 20 MG capsule Take 20 mg by mouth daily at 12 noon.    Marland Kitchen estradiol (ESTRACE) 0.5 MG tablet     . fexofenadine (ALLEGRA) 60 MG tablet Take 60 mg by mouth 2 (two) times daily.    . fluconazole (DIFLUCAN) 150 MG tablet Take 1  tablet (150 mg total) by mouth once. 1 tablet 0  . fluticasone (FLONASE) 50 MCG/ACT nasal spray Place into both nostrils daily.    . furosemide (LASIX) 20 MG tablet Take 20 mg by mouth.    . gabapentin (NEURONTIN) 600 MG tablet Take 600 mg by mouth 3 (three) times daily.    Marland Kitchen glipiZIDE (GLUCOTROL) 5 MG tablet Take by mouth daily before breakfast.    . glycopyrrolate (ROBINUL) 1 MG tablet Take 1 mg by mouth 3 (three) times daily.    . insulin glargine (LANTUS) 100 UNIT/ML injection Inject 100 Units into the skin at bedtime.    . INVOKANA 300 MG TABS     . KLOR-CON M20 20 MEQ tablet     . LANTUS SOLOSTAR 100 UNIT/ML Solostar Pen     . LORazepam (ATIVAN) 1 MG tablet Take 1 mg by mouth every 8 (eight) hours.    Marland Kitchen losartan-hydrochlorothiazide (HYZAAR) 100-25 MG per tablet Take 1 tablet by mouth daily.    Marland Kitchen lubiprostone (AMITIZA) 24 MCG capsule Take 24 mcg by mouth 2 (two) times daily with a meal.    . LYRICA 50 MG capsule     . metFORMIN (GLUCOPHAGE) 1000 MG tablet Take 1,000 mg by mouth 2 (two) times daily  with a meal.    . MYRBETRIQ 25 MG TB24 tablet     . nystatin (MYCOSTATIN/NYSTOP) 100000 UNIT/GM POWD     . nystatin cream (MYCOSTATIN) Apply 1 application topically 2 (two) times daily.    Marland Kitchen omeprazole (PRILOSEC) 20 MG capsule     . ONGLYZA 5 MG TABS tablet     . oxybutynin (DITROPAN XL) 15 MG 24 hr tablet Take 15 mg by mouth at bedtime.    . polyethylene glycol (MIRALAX / GLYCOLAX) packet Take 17 g by mouth daily.    . potassium chloride (K-DUR) 10 MEQ tablet Take 10 mEq by mouth daily.    . Saxagliptin-Metformin (KOMBIGLYZE XR) 2.08-998 MG TB24 Take by mouth 2 (two) times daily.    . silver sulfADIAZINE (SILVADENE) 1 % cream Apply 1 application topically daily. 50 g 0  . sodium chloride (OCEAN) 0.65 % nasal spray Place 1 spray into the nose as needed for congestion.    . sucralfate (CARAFATE) 1 G tablet Take 1 g by mouth 4 (four) times daily -  with meals and at bedtime.    . traMADol  (ULTRAM) 50 MG tablet Take by mouth every 6 (six) hours as needed.    . Transdermal Patch PTCH by Does not apply route. Apply 1 patch once weekly and remove old patch before applying the new patch/lc    . traZODone (DESYREL) 100 MG tablet Take 100 mg by mouth at bedtime.    . traZODone (DESYREL) 50 MG tablet     . triamcinolone cream (KENALOG) 0.1 % Apply 1 application topically 2 (two) times daily.    . valACYclovir (VALTREX) 1000 MG tablet     . VESICARE 10 MG tablet      No current facility-administered medications on file prior to visit.    Allergies  Allergen Reactions  . Demerol [Meperidine]   . Peanut Butter Flavor   . Tape    Objective: General: Patient is awake, alert, and oriented x 3 and in no acute distress.  Integument: Skin is warm, dry and supple bilateral. Nails are tender, long, thickened and dystrophic with subungual debris, consistent with onychomycosis, 1-5 bilateral. + Very minimal corn at Left 3rd toe lateral aspect. No open lesions present bilateral. Old surgical scars Remaining integument unremarkable.  Vasculature:  Dorsalis Pedis pulse1/4 bilateral. Posterior Tibial pulse 1 /4 bilateral.  Capillary fill time <4 sec 1-5 bilateral. Scant hair growth to the level of the digits. Temperature gradient within normal limits.  + varicosities present bilateral. No edema present bilateral.   Neurology: The patient has absent sensation measured with a 5.07/10g Semmes Weinstein Monofilament at all pedal sites bilateral . Vibratory sensation diminished bilateral with tuning fork. No Babinski sign present bilateral.   Musculoskeletal: Bunion with crossover and hammertoes deformities noted bilateral, midfoot exostosis on right. Muscular strength 5/5 in all lower extremity muscular groups bilateral without pain or limitation on range of motion . No tenderness with calf compression bilateral.  Assessment and Plan: Problem List Items Addressed This Visit    None    Visit  Diagnoses    Dermatophytosis of nail    -  Primary   Type 2 diabetes, controlled, with neuropathy (HCC)       Foot pain, bilateral          -Examined patient. -Discussed and educated patient on diabetic foot care, especially with  regards to the vascular, neurological and musculoskeletal systems.  -Stressed the importance of good glycemic control and the detriment  of not  controlling glucose levels in relation to the foot. -Mechanically debrided all nails 1-5 bilateral using sterile nail nipper and filed with dremel without incident. -Recommend continue with Good supportive shoes for foot type and use of diabetic shoes -Answered all patient questions -Patient to return in 2.5 months for at risk foot care -Patient advised to call the office if any problems or questions arise in the meantime.  Landis Martins, DPM

## 2017-02-03 NOTE — Progress Notes (Signed)
   Subjective:    Patient ID: Shannon [name], female    DOB: 07/20/1940, 76 y.o.   MRN: [id number]  HPI    Review of Systems  All other systems reviewed and are negative.      Objective:   Physical Exam        Assessment & Plan:

## 2017-04-21 ENCOUNTER — Ambulatory Visit: Payer: Medicare Other | Admitting: Sports Medicine

## 2017-04-28 ENCOUNTER — Encounter: Payer: Self-pay | Admitting: Sports Medicine

## 2017-04-28 ENCOUNTER — Ambulatory Visit (INDEPENDENT_AMBULATORY_CARE_PROVIDER_SITE_OTHER): Payer: Medicare Other | Admitting: Sports Medicine

## 2017-04-28 DIAGNOSIS — M79671 Pain in right foot: Secondary | ICD-10-CM

## 2017-04-28 DIAGNOSIS — M79672 Pain in left foot: Secondary | ICD-10-CM

## 2017-04-28 DIAGNOSIS — E1140 Type 2 diabetes mellitus with diabetic neuropathy, unspecified: Secondary | ICD-10-CM

## 2017-04-28 DIAGNOSIS — B351 Tinea unguium: Secondary | ICD-10-CM | POA: Diagnosis not present

## 2017-04-28 NOTE — Progress Notes (Signed)
Patient ID: Shannon [name], female   DOB: 12-30-1940, 77 y.o.   MRN: [id number]  Subjective: Shannon [name] is a 77 y.o. female patient with history of type 2 diabetes who returns to office today complaining of long, painful nails while ambulating in shoes; unable to trim. Patient states that the glucose readings have been the same, 121, this AM, Patient denies any new changes in medication or new problems.   Patient lives at [facility].Will see PCP at facility and hasn't met the doctor yet.  There are no active problems to display for this patient.  Current Outpatient Medications on File Prior to Visit  Medication Sig Dispense Refill  . acetaminophen-codeine (TYLENOL #3) 300-30 MG per tablet     . ALPRAZolam (XANAX) 0.25 MG tablet     . amoxicillin (AMOXIL) 500 MG capsule Take 500 mg by mouth 3 (three) times daily.    Marland Kitchen atorvastatin (LIPITOR) 10 MG tablet Take 10 mg by mouth daily.    . bisacodyl (DULCOLAX) 5 MG EC tablet Take 5 mg by mouth daily as needed for moderate constipation.    . cadexomer iodine (IODOSORB) 0.9 % gel Apply 1 application topically daily as needed for wound care.    . cephALEXin (KEFLEX) 500 MG capsule Take 1 capsule (500 mg total) by mouth 3 (three) times daily. 30 capsule 0  . citalopram (CELEXA) 20 MG tablet Take 20 mg by mouth daily.    Marland Kitchen DEXILANT 60 MG capsule     . donepezil (ARICEPT) 10 MG tablet Take 10 mg by mouth at bedtime.    Marland Kitchen doxycycline (VIBRA-TABS) 100 MG tablet Take 1 tablet (100 mg total) by mouth 2 (two) times daily. 20 tablet 0  . Elastic Bandages & Supports (NEXCARE COBAN WRAP 2"X5MW) MISC 1 application by Does not apply route daily. 1 each 3  . ergocalciferol (VITAMIN D2) 50000 UNITS capsule Take by mouth once a week.    . esomeprazole (NEXIUM) 20 MG capsule Take 20 mg by mouth daily at 12 noon.    Marland Kitchen estradiol (ESTRACE) 0.5 MG tablet     . fexofenadine (ALLEGRA) 60 MG tablet Take 60 mg by mouth 2 (two) times  daily.    . fluconazole (DIFLUCAN) 150 MG tablet Take 1 tablet (150 mg total) by mouth once. 1 tablet 0  . fluticasone (FLONASE) 50 MCG/ACT nasal spray Place into both nostrils daily.    . furosemide (LASIX) 20 MG tablet Take 20 mg by mouth.    . gabapentin (NEURONTIN) 600 MG tablet Take 600 mg by mouth 3 (three) times daily.    Marland Kitchen glipiZIDE (GLUCOTROL) 5 MG tablet Take by mouth daily before breakfast.    . glycopyrrolate (ROBINUL) 1 MG tablet Take 1 mg by mouth 3 (three) times daily.    . insulin glargine (LANTUS) 100 UNIT/ML injection Inject 100 Units into the skin at bedtime.    . INVOKANA 300 MG TABS     . KLOR-CON M20 20 MEQ tablet     . LANTUS SOLOSTAR 100 UNIT/ML Solostar Pen     . LORazepam (ATIVAN) 1 MG tablet Take 1 mg by mouth every 8 (eight) hours.    Marland Kitchen losartan-hydrochlorothiazide (HYZAAR) 100-25 MG per tablet Take 1 tablet by mouth daily.    Marland Kitchen lubiprostone (AMITIZA) 24 MCG capsule Take 24 mcg by mouth 2 (two) times daily with a meal.    . LYRICA 50 MG capsule     . metFORMIN (GLUCOPHAGE) 1000 MG  tablet Take 1,000 mg by mouth 2 (two) times daily with a meal.    . MYRBETRIQ 25 MG TB24 tablet     . nystatin (MYCOSTATIN/NYSTOP) 100000 UNIT/GM POWD     . nystatin cream (MYCOSTATIN) Apply 1 application topically 2 (two) times daily.    Marland Kitchen omeprazole (PRILOSEC) 20 MG capsule     . ONGLYZA 5 MG TABS tablet     . oxybutynin (DITROPAN XL) 15 MG 24 hr tablet Take 15 mg by mouth at bedtime.    . polyethylene glycol (MIRALAX / GLYCOLAX) packet Take 17 g by mouth daily.    . potassium chloride (K-DUR) 10 MEQ tablet Take 10 mEq by mouth daily.    . Saxagliptin-Metformin (KOMBIGLYZE XR) 2.08-998 MG TB24 Take by mouth 2 (two) times daily.    . silver sulfADIAZINE (SILVADENE) 1 % cream Apply 1 application topically daily. 50 g 0  . sodium chloride (OCEAN) 0.65 % nasal spray Place 1 spray into the nose as needed for congestion.    . sucralfate (CARAFATE) 1 G tablet Take 1 g by mouth 4 (four)  times daily -  with meals and at bedtime.    . traMADol (ULTRAM) 50 MG tablet Take by mouth every 6 (six) hours as needed.    . Transdermal Patch PTCH by Does not apply route. Apply 1 patch once weekly and remove old patch before applying the new patch/lc    . traZODone (DESYREL) 100 MG tablet Take 100 mg by mouth at bedtime.    . traZODone (DESYREL) 50 MG tablet     . triamcinolone cream (KENALOG) 0.1 % Apply 1 application topically 2 (two) times daily.    . valACYclovir (VALTREX) 1000 MG tablet     . VESICARE 10 MG tablet      No current facility-administered medications on file prior to visit.    Allergies  Allergen Reactions  . Demerol [Meperidine]   . Peanut Butter Flavor   . Tape    Objective: General: Patient is awake, alert, and oriented x 3 and in no acute distress.  Integument: Skin is warm, dry and supple bilateral. Nails are tender, long, thickened and dystrophic with subungual debris, consistent with onychomycosis, 1-5 bilateral. + Very minimal corn at Left 3rd toe lateral aspect. No open lesions present bilateral. Old surgical scars Remaining integument unremarkable.  Vasculature:  Dorsalis Pedis pulse1/4 bilateral. Posterior Tibial pulse 1 /4 bilateral.  Capillary fill time <4 sec 1-5 bilateral. Scant hair growth to the level of the digits. Temperature gradient within normal limits.  + varicosities present bilateral. No edema present bilateral.   Neurology: The patient has absent sensation measured with a 5.07/10g Semmes Weinstein Monofilament at all pedal sites bilateral . Vibratory sensation diminished bilateral with tuning fork. No Babinski sign present bilateral.   Musculoskeletal: Bunion with crossover and hammertoes deformities noted bilateral, midfoot exostosis on right. Muscular strength 5/5 in all lower extremity muscular groups bilateral without pain or limitation on range of motion . No tenderness with calf compression bilateral.  Assessment and Plan: Problem  List Items Addressed This Visit    None    Visit Diagnoses    Dermatophytosis of nail    -  Primary   Type 2 diabetes, controlled, with neuropathy (HCC)       Foot pain, bilateral          -Examined patient. -Discussed and educated patient on diabetic foot care, especially with  regards to the vascular, neurological and musculoskeletal systems.  -  Stressed the importance of good glycemic control and the detriment of not  controlling glucose levels in relation to the foot. -Mechanically debrided all nails 1-5 bilateral using sterile nail nipper and filed with dremel without incident. -Recommend continue with good supportive shoes for foot type and use of diabetic shoes -Answered all patient questions -Patient to return in 2.5 months for at risk foot care -Patient advised to call the office if any problems or questions arise in the meantime.  Landis Martins, DPM

## 2017-07-28 ENCOUNTER — Encounter: Payer: Self-pay | Admitting: Sports Medicine

## 2017-07-28 ENCOUNTER — Ambulatory Visit (INDEPENDENT_AMBULATORY_CARE_PROVIDER_SITE_OTHER): Payer: Medicare Other | Admitting: Sports Medicine

## 2017-07-28 DIAGNOSIS — M79672 Pain in left foot: Secondary | ICD-10-CM | POA: Diagnosis not present

## 2017-07-28 DIAGNOSIS — E1140 Type 2 diabetes mellitus with diabetic neuropathy, unspecified: Secondary | ICD-10-CM | POA: Diagnosis not present

## 2017-07-28 DIAGNOSIS — B351 Tinea unguium: Secondary | ICD-10-CM | POA: Diagnosis not present

## 2017-07-28 DIAGNOSIS — M79671 Pain in right foot: Secondary | ICD-10-CM

## 2017-07-28 NOTE — Progress Notes (Signed)
Patient ID: Shannon [name], female   DOB: 10-Aug-1940, 77 y.o.   MRN: [id number]  Subjective: Shannon [name] is a 77 y.o. female patient with history of type 2 diabetes who returns to office today complaining of long, painful nails while ambulating in shoes; unable to trim. Patient states that the glucose readings have been the same, cannot recall the numbers states that his recorded facility.  Patient denies any new changes in medication or new problems.   Patient lives at [facility].  There are no active problems to display for this patient.  Current Outpatient Medications on File Prior to Visit  Medication Sig Dispense Refill  . acetaminophen-codeine (TYLENOL #3) 300-30 MG per tablet     . ALPRAZolam (XANAX) 0.25 MG tablet     . amoxicillin (AMOXIL) 500 MG capsule Take 500 mg by mouth 3 (three) times daily.    Marland Kitchen atorvastatin (LIPITOR) 10 MG tablet Take 10 mg by mouth daily.    . bisacodyl (DULCOLAX) 5 MG EC tablet Take 5 mg by mouth daily as needed for moderate constipation.    . cadexomer iodine (IODOSORB) 0.9 % gel Apply 1 application topically daily as needed for wound care.    . cephALEXin (KEFLEX) 500 MG capsule Take 1 capsule (500 mg total) by mouth 3 (three) times daily. 30 capsule 0  . citalopram (CELEXA) 20 MG tablet Take 20 mg by mouth daily.    Marland Kitchen DEXILANT 60 MG capsule     . donepezil (ARICEPT) 10 MG tablet Take 10 mg by mouth at bedtime.    Marland Kitchen doxycycline (VIBRA-TABS) 100 MG tablet Take 1 tablet (100 mg total) by mouth 2 (two) times daily. 20 tablet 0  . Elastic Bandages & Supports (NEXCARE COBAN WRAP 4"W5IO) MISC 1 application by Does not apply route daily. 1 each 3  . ergocalciferol (VITAMIN D2) 50000 UNITS capsule Take by mouth once a week.    . esomeprazole (NEXIUM) 20 MG capsule Take 20 mg by mouth daily at 12 noon.    Marland Kitchen estradiol (ESTRACE) 0.5 MG tablet     . fexofenadine (ALLEGRA) 60 MG tablet Take 60 mg by mouth 2 (two) times daily.    .  fluconazole (DIFLUCAN) 150 MG tablet Take 1 tablet (150 mg total) by mouth once. 1 tablet 0  . fluticasone (FLONASE) 50 MCG/ACT nasal spray Place into both nostrils daily.    . furosemide (LASIX) 20 MG tablet Take 20 mg by mouth.    . gabapentin (NEURONTIN) 600 MG tablet Take 600 mg by mouth 3 (three) times daily.    Marland Kitchen glipiZIDE (GLUCOTROL) 5 MG tablet Take by mouth daily before breakfast.    . glycopyrrolate (ROBINUL) 1 MG tablet Take 1 mg by mouth 3 (three) times daily.    . insulin glargine (LANTUS) 100 UNIT/ML injection Inject 100 Units into the skin at bedtime.    . INVOKANA 300 MG TABS     . KLOR-CON M20 20 MEQ tablet     . LANTUS SOLOSTAR 100 UNIT/ML Solostar Pen     . LORazepam (ATIVAN) 1 MG tablet Take 1 mg by mouth every 8 (eight) hours.    Marland Kitchen losartan-hydrochlorothiazide (HYZAAR) 100-25 MG per tablet Take 1 tablet by mouth daily.    Marland Kitchen lubiprostone (AMITIZA) 24 MCG capsule Take 24 mcg by mouth 2 (two) times daily with a meal.    . LYRICA 50 MG capsule     . metFORMIN (GLUCOPHAGE) 1000 MG tablet Take 1,000  mg by mouth 2 (two) times daily with a meal.    . MYRBETRIQ 25 MG TB24 tablet     . nystatin (MYCOSTATIN/NYSTOP) 100000 UNIT/GM POWD     . nystatin cream (MYCOSTATIN) Apply 1 application topically 2 (two) times daily.    Marland Kitchen omeprazole (PRILOSEC) 20 MG capsule     . ONGLYZA 5 MG TABS tablet     . oxybutynin (DITROPAN XL) 15 MG 24 hr tablet Take 15 mg by mouth at bedtime.    . polyethylene glycol (MIRALAX / GLYCOLAX) packet Take 17 g by mouth daily.    . potassium chloride (K-DUR) 10 MEQ tablet Take 10 mEq by mouth daily.    . Saxagliptin-Metformin (KOMBIGLYZE XR) 2.08-998 MG TB24 Take by mouth 2 (two) times daily.    . silver sulfADIAZINE (SILVADENE) 1 % cream Apply 1 application topically daily. 50 g 0  . sodium chloride (OCEAN) 0.65 % nasal spray Place 1 spray into the nose as needed for congestion.    . sucralfate (CARAFATE) 1 G tablet Take 1 g by mouth 4 (four) times daily -   with meals and at bedtime.    . traMADol (ULTRAM) 50 MG tablet Take by mouth every 6 (six) hours as needed.    . Transdermal Patch PTCH by Does not apply route. Apply 1 patch once weekly and remove old patch before applying the new patch/lc    . traZODone (DESYREL) 100 MG tablet Take 100 mg by mouth at bedtime.    . traZODone (DESYREL) 50 MG tablet     . triamcinolone cream (KENALOG) 0.1 % Apply 1 application topically 2 (two) times daily.    . valACYclovir (VALTREX) 1000 MG tablet     . VESICARE 10 MG tablet      No current facility-administered medications on file prior to visit.    Allergies  Allergen Reactions  . Demerol [Meperidine]   . Peanut Butter Flavor   . Tape    Objective: General: Patient is awake, alert, and oriented x 3 and in no acute distress.  Integument: Skin is warm, dry and supple bilateral. Nails are tender, long, thickened and dystrophic with subungual debris, consistent with onychomycosis, 1-5 bilateral. + Very minimal corn at Left 3rd toe lateral aspect. No open lesions present bilateral. Old surgical scars Remaining integument unremarkable.  Vasculature:  Dorsalis Pedis pulse1/4 bilateral. Posterior Tibial pulse 1 /4 bilateral.  Capillary fill time <4 sec 1-5 bilateral. Scant hair growth to the level of the digits. Temperature gradient within normal limits.  + varicosities present bilateral. No edema present bilateral.   Neurology: The patient has absent sensation measured with a 5.07/10g Semmes Weinstein Monofilament at all pedal sites bilateral . Vibratory sensation diminished bilateral with tuning fork. No Babinski sign present bilateral.   Musculoskeletal: Bunion with crossover and hammertoes deformities noted bilateral, midfoot exostosis on right with bilateral pes planus. Muscular strength 5/5 in all lower extremity muscular groups bilateral without pain or limitation on range of motion . No tenderness with calf compression bilateral.  Assessment and  Plan: Problem List Items Addressed This Visit    None    Visit Diagnoses    Dermatophytosis of nail    -  Primary   Type 2 diabetes, controlled, with neuropathy (HCC)       Foot pain, bilateral          -Examined patient. -Discussed and educated patient on diabetic foot care, especially with  regards to the vascular, neurological and musculoskeletal systems.  -  Stressed the importance of good glycemic control and the detriment of not  controlling glucose levels in relation to the foot. -Mechanically debrided all nails 1-5 bilateral using sterile nail nipper and filed with dremel without incident. -Recommend continue with good supportive shoes for foot type and use of diabetic shoes; [employer] diabetic shoe order form was completed; office to contact primary care for approval / certification;  Office to arrange shoe fitting and dispensing. -Answered all patient questions -Patient to return in 2.5 months for at risk foot care -Patient advised to call the office if any problems or questions arise in the meantime.  Landis Martins, DPM

## 2017-08-18 ENCOUNTER — Encounter: Payer: Self-pay | Admitting: Sports Medicine

## 2017-08-18 ENCOUNTER — Ambulatory Visit (INDEPENDENT_AMBULATORY_CARE_PROVIDER_SITE_OTHER): Payer: Medicare Other | Admitting: Sports Medicine

## 2017-08-18 DIAGNOSIS — L97521 Non-pressure chronic ulcer of other part of left foot limited to breakdown of skin: Secondary | ICD-10-CM

## 2017-08-18 DIAGNOSIS — E1140 Type 2 diabetes mellitus with diabetic neuropathy, unspecified: Secondary | ICD-10-CM

## 2017-08-18 MED ORDER — MEDIHONEY WOUND/BURN DRESSING EX GEL: CUTANEOUS | 1 refills | Status: DC

## 2017-08-18 NOTE — Progress Notes (Signed)
Subjective: Shannon [name] is a 77 y.o. female patient seen in office for evaluation of ulceration of the left 3rd toe ulceration. Patient has a history of diabetes and a blood glucose level today of 147 mg/dl.   Patient is changing the dressing using  Neosporin at [facility]/ with help of nursing 3x per week. Admits to episode when wound 1st occurred of bloody drainage, swelling, and redness that has now gotten better. Denies nausea/fever/vomiting/chills/night sweats/shortness of breath/pain. Patient has no other pedal complaints at this time.  There are no active problems to display for this patient.  Current Outpatient Medications on File Prior to Visit  Medication Sig Dispense Refill  . acetaminophen-codeine (TYLENOL #3) 300-30 MG per tablet     . ALPRAZolam (XANAX) 0.25 MG tablet     . amoxicillin (AMOXIL) 500 MG capsule Take 500 mg by mouth 3 (three) times daily.    Marland Kitchen atorvastatin (LIPITOR) 10 MG tablet Take 10 mg by mouth daily.    . bisacodyl (DULCOLAX) 5 MG EC tablet Take 5 mg by mouth daily as needed for moderate constipation.    . cadexomer iodine (IODOSORB) 0.9 % gel Apply 1 application topically daily as needed for wound care.    . cephALEXin (KEFLEX) 500 MG capsule Take 1 capsule (500 mg total) by mouth 3 (three) times daily. 30 capsule 0  . citalopram (CELEXA) 20 MG tablet Take 20 mg by mouth daily.    Marland Kitchen DEXILANT 60 MG capsule     . donepezil (ARICEPT) 10 MG tablet Take 10 mg by mouth at bedtime.    Marland Kitchen doxycycline (VIBRA-TABS) 100 MG tablet Take 1 tablet (100 mg total) by mouth 2 (two) times daily. 20 tablet 0  . Elastic Bandages & Supports (NEXCARE COBAN WRAP 1"O1WR) MISC 1 application by Does not apply route daily. 1 each 3  . ergocalciferol (VITAMIN D2) 50000 UNITS capsule Take by mouth once a week.    . esomeprazole (NEXIUM) 20 MG capsule Take 20 mg by mouth daily at 12 noon.    Marland Kitchen estradiol (ESTRACE) 0.5 MG tablet     . fexofenadine (ALLEGRA) 60 MG tablet Take  60 mg by mouth 2 (two) times daily.    . fluconazole (DIFLUCAN) 150 MG tablet Take 1 tablet (150 mg total) by mouth once. 1 tablet 0  . fluticasone (FLONASE) 50 MCG/ACT nasal spray Place into both nostrils daily.    . furosemide (LASIX) 20 MG tablet Take 20 mg by mouth.    . gabapentin (NEURONTIN) 600 MG tablet Take 600 mg by mouth 3 (three) times daily.    Marland Kitchen glipiZIDE (GLUCOTROL) 5 MG tablet Take by mouth daily before breakfast.    . glycopyrrolate (ROBINUL) 1 MG tablet Take 1 mg by mouth 3 (three) times daily.    . insulin glargine (LANTUS) 100 UNIT/ML injection Inject 100 Units into the skin at bedtime.    . INVOKANA 300 MG TABS     . KLOR-CON M20 20 MEQ tablet     . LANTUS SOLOSTAR 100 UNIT/ML Solostar Pen     . LORazepam (ATIVAN) 1 MG tablet Take 1 mg by mouth every 8 (eight) hours.    Marland Kitchen losartan-hydrochlorothiazide (HYZAAR) 100-25 MG per tablet Take 1 tablet by mouth daily.    Marland Kitchen lubiprostone (AMITIZA) 24 MCG capsule Take 24 mcg by mouth 2 (two) times daily with a meal.    . LYRICA 50 MG capsule     . metFORMIN (GLUCOPHAGE) 1000 MG tablet Take  1,000 mg by mouth 2 (two) times daily with a meal.    . MYRBETRIQ 25 MG TB24 tablet     . nystatin (MYCOSTATIN/NYSTOP) 100000 UNIT/GM POWD     . nystatin cream (MYCOSTATIN) Apply 1 application topically 2 (two) times daily.    Marland Kitchen omeprazole (PRILOSEC) 20 MG capsule     . ONGLYZA 5 MG TABS tablet     . oxybutynin (DITROPAN XL) 15 MG 24 hr tablet Take 15 mg by mouth at bedtime.    . polyethylene glycol (MIRALAX / GLYCOLAX) packet Take 17 g by mouth daily.    . potassium chloride (K-DUR) 10 MEQ tablet Take 10 mEq by mouth daily.    . Saxagliptin-Metformin (KOMBIGLYZE XR) 2.08-998 MG TB24 Take by mouth 2 (two) times daily.    . silver sulfADIAZINE (SILVADENE) 1 % cream Apply 1 application topically daily. 50 g 0  . sodium chloride (OCEAN) 0.65 % nasal spray Place 1 spray into the nose as needed for congestion.    . sucralfate (CARAFATE) 1 G tablet  Take 1 g by mouth 4 (four) times daily -  with meals and at bedtime.    . traMADol (ULTRAM) 50 MG tablet Take by mouth every 6 (six) hours as needed.    . Transdermal Patch PTCH by Does not apply route. Apply 1 patch once weekly and remove old patch before applying the new patch/lc    . traZODone (DESYREL) 100 MG tablet Take 100 mg by mouth at bedtime.    . traZODone (DESYREL) 50 MG tablet     . triamcinolone cream (KENALOG) 0.1 % Apply 1 application topically 2 (two) times daily.    . valACYclovir (VALTREX) 1000 MG tablet     . VESICARE 10 MG tablet      No current facility-administered medications on file prior to visit.    Allergies  Allergen Reactions  . Demerol [Meperidine]   . Peanut Butter Flavor   . Tape     No results found for this or any previous visit (from the past 2160 hour(s)).  Objective: There were no vitals filed for this visit.  General: Patient is awake, alert, oriented x 3 and in no acute distress.  Dermatology: Skin is warm and dry bilateral with a partial thickness ulceration present  Left 3rd toe distal tuft. Ulceration measures 0.5cm x 0.5 cm x 0.1 cm. There is a  Keratotic border with a granular base. The ulceration does not  probe to bone. There is no malodor, no active drainage, faint blanchable erythema, no edema. + digital deformity of which patient had prior. No other acute signs of infection.   Vascular: Dorsalis Pedis pulse = 1/4 Bilateral,  Posterior Tibial pulse = 1/4 Bilateral,  Capillary Fill Time < 5 seconds  Neurologic: Protective sensation intact/diminished/absent to the level of absent using  the 5.07/10g [employer].  Musculosketal: No Pain with palpation to ulcerated area. No pain with compression to calves bilateral. + Bunion and hammertoe with crossover, midfoot arthritis/exostosis and pes planus gross bony deformities noted bilateral.  No results for input(s): GRAMSTAIN, LABORGA in the last 8760 hours.  Assessment  and Plan:  Problem List Items Addressed This Visit    None    Visit Diagnoses    Toe ulcer, left, limited to breakdown of skin ([place])    -  Primary   Relevant Medications   Wound Dressings (MEDIHONEY WOUND/BURN DRESSING) GEL   Type 2 diabetes, controlled, with neuropathy ([place])         -  Examined patient and discussed the progression of the wound and treatment alternatives. -No xrays this visit due to superifical nature of wound  - Excisionally dedbrided ulceration at left 3rd toe ucleration to healthy bleeding borders removing nonviable tissue using a sterile chisel blade. Wound measures post debridement as above. Wound was debrided to the level of the dermis with viable wound base exposed to promote healing. Hemostasis was achieved with manuel pressure. Patient tolerated procedure well without any discomfort or anesthesia necessary for this wound debridement.  -Applied medihoney and dry sterile dressing and instructed patient to continue with daily dressings at home consisting of the same 3x per week with help of nursing; Orders written on [facility] paperwork  - Advised patient to go to the ER or return to office if the wound worsens or if constitutional symptoms are present. -Patient to return to office in 2 weeks for follow up care and evaluation or sooner if problems arise.  Landis Martins, DPM

## 2017-09-09 ENCOUNTER — Encounter: Payer: Self-pay | Admitting: Sports Medicine

## 2017-09-09 ENCOUNTER — Ambulatory Visit (INDEPENDENT_AMBULATORY_CARE_PROVIDER_SITE_OTHER): Payer: Medicare Other | Admitting: Sports Medicine

## 2017-09-09 DIAGNOSIS — E1140 Type 2 diabetes mellitus with diabetic neuropathy, unspecified: Secondary | ICD-10-CM | POA: Diagnosis not present

## 2017-09-09 DIAGNOSIS — L97521 Non-pressure chronic ulcer of other part of left foot limited to breakdown of skin: Secondary | ICD-10-CM

## 2017-09-09 NOTE — Progress Notes (Signed)
Subjective: Shannon [name] is a 77 y.o. female patient seen in office for evaluation of ulceration of the left 3rd toe ulceration. Patient has a history of diabetes and a blood glucose level today was not recorded.  Patient is changing the dressing using medihoney at [facility]/ with help of nursing 3x per week. Denies swelling, drainage, and redness that has now gotten better. Denies nausea/fever/vomiting/chills/night sweats/shortness of breath/pain. Patient has no other pedal complaints at this time.  There are no active problems to display for this patient.  Current Outpatient Medications on File Prior to Visit  Medication Sig Dispense Refill  . acetaminophen-codeine (TYLENOL #3) 300-30 MG per tablet     . ALPRAZolam (XANAX) 0.25 MG tablet     . amoxicillin (AMOXIL) 500 MG capsule Take 500 mg by mouth 3 (three) times daily.    Marland Kitchen atorvastatin (LIPITOR) 10 MG tablet Take 10 mg by mouth daily.    . bisacodyl (DULCOLAX) 5 MG EC tablet Take 5 mg by mouth daily as needed for moderate constipation.    . cadexomer iodine (IODOSORB) 0.9 % gel Apply 1 application topically daily as needed for wound care.    . cephALEXin (KEFLEX) 500 MG capsule Take 1 capsule (500 mg total) by mouth 3 (three) times daily. 30 capsule 0  . citalopram (CELEXA) 20 MG tablet Take 20 mg by mouth daily.    Marland Kitchen DEXILANT 60 MG capsule     . donepezil (ARICEPT) 10 MG tablet Take 10 mg by mouth at bedtime.    Marland Kitchen doxycycline (VIBRA-TABS) 100 MG tablet Take 1 tablet (100 mg total) by mouth 2 (two) times daily. 20 tablet 0  . Elastic Bandages & Supports (NEXCARE COBAN WRAP 0"J5KK) MISC 1 application by Does not apply route daily. 1 each 3  . ergocalciferol (VITAMIN D2) 50000 UNITS capsule Take by mouth once a week.    . esomeprazole (NEXIUM) 20 MG capsule Take 20 mg by mouth daily at 12 noon.    Marland Kitchen estradiol (ESTRACE) 0.5 MG tablet     . fexofenadine (ALLEGRA) 60 MG tablet Take 60 mg by mouth 2 (two) times daily.    .  fluconazole (DIFLUCAN) 150 MG tablet Take 1 tablet (150 mg total) by mouth once. 1 tablet 0  . fluticasone (FLONASE) 50 MCG/ACT nasal spray Place into both nostrils daily.    . furosemide (LASIX) 20 MG tablet Take 20 mg by mouth.    . gabapentin (NEURONTIN) 600 MG tablet Take 600 mg by mouth 3 (three) times daily.    Marland Kitchen glipiZIDE (GLUCOTROL) 5 MG tablet Take by mouth daily before breakfast.    . glycopyrrolate (ROBINUL) 1 MG tablet Take 1 mg by mouth 3 (three) times daily.    . insulin glargine (LANTUS) 100 UNIT/ML injection Inject 100 Units into the skin at bedtime.    . INVOKANA 300 MG TABS     . KLOR-CON M20 20 MEQ tablet     . LANTUS SOLOSTAR 100 UNIT/ML Solostar Pen     . LORazepam (ATIVAN) 1 MG tablet Take 1 mg by mouth every 8 (eight) hours.    Marland Kitchen losartan-hydrochlorothiazide (HYZAAR) 100-25 MG per tablet Take 1 tablet by mouth daily.    Marland Kitchen lubiprostone (AMITIZA) 24 MCG capsule Take 24 mcg by mouth 2 (two) times daily with a meal.    . LYRICA 50 MG capsule     . metFORMIN (GLUCOPHAGE) 1000 MG tablet Take 1,000 mg by mouth 2 (two) times daily with a  meal.    . MYRBETRIQ 25 MG TB24 tablet     . nystatin (MYCOSTATIN/NYSTOP) 100000 UNIT/GM POWD     . nystatin cream (MYCOSTATIN) Apply 1 application topically 2 (two) times daily.    Marland Kitchen omeprazole (PRILOSEC) 20 MG capsule     . ONGLYZA 5 MG TABS tablet     . oxybutynin (DITROPAN XL) 15 MG 24 hr tablet Take 15 mg by mouth at bedtime.    . polyethylene glycol (MIRALAX / GLYCOLAX) packet Take 17 g by mouth daily.    . potassium chloride (K-DUR) 10 MEQ tablet Take 10 mEq by mouth daily.    . Saxagliptin-Metformin (KOMBIGLYZE XR) 2.08-998 MG TB24 Take by mouth 2 (two) times daily.    . silver sulfADIAZINE (SILVADENE) 1 % cream Apply 1 application topically daily. 50 g 0  . sodium chloride (OCEAN) 0.65 % nasal spray Place 1 spray into the nose as needed for congestion.    . sucralfate (CARAFATE) 1 G tablet Take 1 g by mouth 4 (four) times daily -   with meals and at bedtime.    . traMADol (ULTRAM) 50 MG tablet Take by mouth every 6 (six) hours as needed.    . Transdermal Patch PTCH by Does not apply route. Apply 1 patch once weekly and remove old patch before applying the new patch/lc    . traZODone (DESYREL) 100 MG tablet Take 100 mg by mouth at bedtime.    . traZODone (DESYREL) 50 MG tablet     . triamcinolone cream (KENALOG) 0.1 % Apply 1 application topically 2 (two) times daily.    . valACYclovir (VALTREX) 1000 MG tablet     . VESICARE 10 MG tablet     . Wound Dressings (MEDIHONEY WOUND/BURN DRESSING) GEL Apply to left toe ulceration 3x per week 15 mL 1   No current facility-administered medications on file prior to visit.    Allergies  Allergen Reactions  . Demerol [Meperidine]   . Peanut Butter Flavor   . Tape     No results found for this or any previous visit (from the past 2160 hour(s)).  Objective: There were no vitals filed for this visit.  General: Patient is awake, alert, oriented x 3 and in no acute distress.  Dermatology: Skin is warm and dry bilateral with a partial thickness ulceration present Left 3rd toe distal tuft. Ulceration measures 0.4cm x 0.5 cm x 0.1 cm. There is a Keratotic border with a granular base. The ulceration does not probe to bone. There is no malodor, no active drainage, faint blanchable erythema, no edema. + digital deformity of which patient had prior. No other acute signs of infection.   Vascular: Dorsalis Pedis pulse = 1/4 Bilateral,  Posterior Tibial pulse = 1/4 Bilateral,  Capillary Fill Time < 5 seconds  Neurologic: Protective sensation intact/diminished/absent to the level of absent using  the 5.07/10g [employer].  Musculosketal: No Pain with palpation to ulcerated area. No pain with compression to calves bilateral. + Bunion and hammertoe with crossover, midfoot arthritis/exostosis and pes planus gross bony deformities noted bilateral.  No results for input(s):  GRAMSTAIN, LABORGA in the last 8760 hours.  Assessment and Plan:  Problem List Items Addressed This Visit    None    Visit Diagnoses    Toe ulcer, left, limited to breakdown of skin ([place])    -  Primary   Type 2 diabetes, controlled, with neuropathy ([place])         -Examined patient  and discussed the progression of the wound and treatment alternatives. - Excisionally dedbrided ulceration at left 3rd toe ucleration to healthy bleeding borders removing nonviable tissue using a sterile chisel blade. Wound measures post debridement as above. Wound was debrided to the level of the dermis with viable wound base exposed to promote healing. Hemostasis was achieved with manuel pressure. Patient tolerated procedure well without any discomfort or anesthesia necessary for this wound debridement.  -Applied medihoney and dry sterile dressing and instructed patient to continue with daily dressings at home consisting of the same 3x per week with help of nursing; Orders written on [facility] paperwork to continue with the same - Advised patient to go to the ER or return to office if the wound worsens or if constitutional symptoms are present. -Patient to return to office in 2-3 weeks for follow up care and evaluation or sooner if problems arise.  Landis Martins, DPM

## 2017-09-29 ENCOUNTER — Encounter: Payer: Self-pay | Admitting: Sports Medicine

## 2017-09-29 ENCOUNTER — Ambulatory Visit (INDEPENDENT_AMBULATORY_CARE_PROVIDER_SITE_OTHER): Payer: Medicare Other | Admitting: Sports Medicine

## 2017-09-29 VITALS — BP 138/69 | HR 82 | Temp 97.0°F | Resp 16

## 2017-09-29 DIAGNOSIS — S90811A Abrasion, right foot, initial encounter: Secondary | ICD-10-CM

## 2017-09-29 DIAGNOSIS — L97521 Non-pressure chronic ulcer of other part of left foot limited to breakdown of skin: Secondary | ICD-10-CM | POA: Diagnosis not present

## 2017-09-29 DIAGNOSIS — E1140 Type 2 diabetes mellitus with diabetic neuropathy, unspecified: Secondary | ICD-10-CM

## 2017-09-29 NOTE — Progress Notes (Signed)
Subjective: Shannon [name] is a 77 y.o. female patient seen in office for evaluation of ulceration of the left 3rd toe ulceration. Patient has a history of diabetes and a blood glucose level today was 108. Patient is changing the dressing using medihoney at [facility] assisted [facility]/ with help of nursing 3x per week. Denies swelling, drainage, and redness that's slowly getting better. Denies nausea/fever/vomiting/chills/night sweats/shortness of breath/pain. Patient has no other pedal complaints at this time.  There are no active problems to display for this patient.  Current Outpatient Medications on File Prior to Visit  Medication Sig Dispense Refill  . acetaminophen-codeine (TYLENOL #3) 300-30 MG per tablet     . ALPRAZolam (XANAX) 0.25 MG tablet     . amoxicillin (AMOXIL) 500 MG capsule Take 500 mg by mouth 3 (three) times daily.    Marland Kitchen atorvastatin (LIPITOR) 10 MG tablet Take 10 mg by mouth daily.    . bisacodyl (DULCOLAX) 5 MG EC tablet Take 5 mg by mouth daily as needed for moderate constipation.    . cadexomer iodine (IODOSORB) 0.9 % gel Apply 1 application topically daily as needed for wound care.    . cephALEXin (KEFLEX) 500 MG capsule Take 1 capsule (500 mg total) by mouth 3 (three) times daily. 30 capsule 0  . citalopram (CELEXA) 20 MG tablet Take 20 mg by mouth daily.    Marland Kitchen DEXILANT 60 MG capsule     . donepezil (ARICEPT) 10 MG tablet Take 10 mg by mouth at bedtime.    Marland Kitchen doxycycline (VIBRA-TABS) 100 MG tablet Take 1 tablet (100 mg total) by mouth 2 (two) times daily. 20 tablet 0  . Elastic Bandages & Supports (NEXCARE COBAN WRAP 3"X5QM) MISC 1 application by Does not apply route daily. 1 each 3  . ergocalciferol (VITAMIN D2) 50000 UNITS capsule Take by mouth once a week.    . esomeprazole (NEXIUM) 20 MG capsule Take 20 mg by mouth daily at 12 noon.    Marland Kitchen estradiol (ESTRACE) 0.5 MG tablet     . fexofenadine (ALLEGRA) 60 MG tablet Take 60 mg by mouth 2 (two) times daily.    .  fluconazole (DIFLUCAN) 150 MG tablet Take 1 tablet (150 mg total) by mouth once. 1 tablet 0  . fluticasone (FLONASE) 50 MCG/ACT nasal spray Place into both nostrils daily.    . furosemide (LASIX) 20 MG tablet Take 20 mg by mouth.    . gabapentin (NEURONTIN) 600 MG tablet Take 600 mg by mouth 3 (three) times daily.    Marland Kitchen glipiZIDE (GLUCOTROL) 5 MG tablet Take by mouth daily before breakfast.    . glycopyrrolate (ROBINUL) 1 MG tablet Take 1 mg by mouth 3 (three) times daily.    . insulin glargine (LANTUS) 100 UNIT/ML injection Inject 100 Units into the skin at bedtime.    . INVOKANA 300 MG TABS     . KLOR-CON M20 20 MEQ tablet     . LANTUS SOLOSTAR 100 UNIT/ML Solostar Pen     . LORazepam (ATIVAN) 1 MG tablet Take 1 mg by mouth every 8 (eight) hours.    Marland Kitchen losartan-hydrochlorothiazide (HYZAAR) 100-25 MG per tablet Take 1 tablet by mouth daily.    Marland Kitchen lubiprostone (AMITIZA) 24 MCG capsule Take 24 mcg by mouth 2 (two) times daily with a meal.    . LYRICA 50 MG capsule     . metFORMIN (GLUCOPHAGE) 1000 MG tablet Take 1,000 mg by mouth 2 (two) times daily with a meal.    .  MYRBETRIQ 25 MG TB24 tablet     . nystatin (MYCOSTATIN/NYSTOP) 100000 UNIT/GM POWD     . nystatin cream (MYCOSTATIN) Apply 1 application topically 2 (two) times daily.    Marland Kitchen omeprazole (PRILOSEC) 20 MG capsule     . ONGLYZA 5 MG TABS tablet     . oxybutynin (DITROPAN XL) 15 MG 24 hr tablet Take 15 mg by mouth at bedtime.    . polyethylene glycol (MIRALAX / GLYCOLAX) packet Take 17 g by mouth daily.    . potassium chloride (K-DUR) 10 MEQ tablet Take 10 mEq by mouth daily.    . Saxagliptin-Metformin (KOMBIGLYZE XR) 2.08-998 MG TB24 Take by mouth 2 (two) times daily.    . silver sulfADIAZINE (SILVADENE) 1 % cream Apply 1 application topically daily. 50 g 0  . sodium chloride (OCEAN) 0.65 % nasal spray Place 1 spray into the nose as needed for congestion.    . sucralfate (CARAFATE) 1 G tablet Take 1 g by mouth 4 (four) times daily -   with meals and at bedtime.    . traMADol (ULTRAM) 50 MG tablet Take by mouth every 6 (six) hours as needed.    . Transdermal Patch PTCH by Does not apply route. Apply 1 patch once weekly and remove old patch before applying the new patch/lc    . traZODone (DESYREL) 100 MG tablet Take 100 mg by mouth at bedtime.    . traZODone (DESYREL) 50 MG tablet     . triamcinolone cream (KENALOG) 0.1 % Apply 1 application topically 2 (two) times daily.    . valACYclovir (VALTREX) 1000 MG tablet     . VESICARE 10 MG tablet     . Wound Dressings (MEDIHONEY WOUND/BURN DRESSING) GEL Apply to left toe ulceration 3x per week 15 mL 1   No current facility-administered medications on file prior to visit.    Allergies  Allergen Reactions  . Demerol [Meperidine]   . Peanut Butter Flavor   . Tape     No results found for this or any previous visit (from the past 2160 hour(s)).  Objective: There were no vitals filed for this visit.  General: Patient is awake, alert, oriented x 3 and in no acute distress.  Dermatology: Skin is warm and dry bilateral with a partial thickness ulceration present Left 3rd toe distal tuft. Ulceration measures 0.8cm x 0.7 cm x 0.1 cm, larger than previous. There is a Keratotic border with a granular base. The ulceration does not probe to bone. There is no malodor, no active drainage, faint blanchable erythema, no edema. + digital deformity of which patient had prior. + Abrasion to right foot. No other acute signs of infection.   Vascular: Dorsalis Pedis pulse = 1/4 Bilateral,  Posterior Tibial pulse = 1/4 Bilateral,  Capillary Fill Time < 5 seconds  Neurologic: Protective sensation intact/diminished/absent to the level of absent using  the 5.07/10g [employer].  Musculosketal: No Pain with palpation to ulcerated area. No pain with compression to calves bilateral. + Bunion and hammertoe with crossover, midfoot arthritis/exostosis and pes planus gross bony  deformities noted bilateral.  No results for input(s): GRAMSTAIN, LABORGA in the last 8760 hours.  Assessment and Plan:  Problem List Items Addressed This Visit    None    Visit Diagnoses    Toe ulcer, left, limited to breakdown of skin ([place])    -  Primary   Abrasion, right foot, initial encounter       Type 2 diabetes,  controlled, with neuropathy ([place])         -Examined patient and discussed the progression of the wound and treatment alternatives. - Excisionally dedbrided ulceration at left 3rd toe ucleration to healthy bleeding borders removing nonviable tissue using a sterile chisel blade. Wound measures post debridement as above. Wound was debrided to the level of the dermis with viable wound base exposed to promote healing. Hemostasis was achieved with manuel pressure. Patient tolerated procedure well without any discomfort or anesthesia necessary for this wound debridement.  -Applied Polymem #1 dressing to left toe and antibiotic cream to right abrasion and instructed patient to continue with same at home consisting of the same 3x per week with help of nursing; Office to fax orders - Advised patient to go to the ER or return to office if the wound worsens or if constitutional symptoms are present. -Patient to return to office in 2-3 weeks for follow up care and evaluation or sooner if problems arise. Will trim nails at next visit as well.   Landis Martins, DPM

## 2017-09-30 ENCOUNTER — Telehealth: Payer: Self-pay | Admitting: *Deleted

## 2017-09-30 NOTE — Telephone Encounter (Signed)
Left message to have pt call concerning home health care.

## 2017-09-30 NOTE — Telephone Encounter (Signed)
Pt's dtr, Anderson Malta states pt is in assisted living and [facility] performs the wound care. Faxed Dr. Leeanne Rio 09/29/2017 2:00pm orders to [facility].

## 2017-09-30 NOTE — Telephone Encounter (Signed)
-----   Message from [place], [place] sent at 09/29/2017  2:00 PM EDT ----- Regarding: Wound care orders  Applied Polymem #1 dressing to left toe and antibiotic cream to right abrasion and instructed patient to continue with same at home consisting of the same 3x per week with help of nursing

## 2017-09-30 NOTE — Telephone Encounter (Signed)
Left message for Ms Gerilyn Nestle to have pt call in case I was not able to contact her by our current listed phone number.

## 2017-10-28 ENCOUNTER — Ambulatory Visit: Payer: Medicare Other | Admitting: *Deleted

## 2017-10-28 ENCOUNTER — Ambulatory Visit (INDEPENDENT_AMBULATORY_CARE_PROVIDER_SITE_OTHER): Payer: Medicare Other | Admitting: Sports Medicine

## 2017-10-28 ENCOUNTER — Encounter: Payer: Self-pay | Admitting: Sports Medicine

## 2017-10-28 DIAGNOSIS — B351 Tinea unguium: Secondary | ICD-10-CM | POA: Diagnosis not present

## 2017-10-28 DIAGNOSIS — E1140 Type 2 diabetes mellitus with diabetic neuropathy, unspecified: Secondary | ICD-10-CM

## 2017-10-28 DIAGNOSIS — L97521 Non-pressure chronic ulcer of other part of left foot limited to breakdown of skin: Secondary | ICD-10-CM

## 2017-10-28 DIAGNOSIS — M79671 Pain in right foot: Secondary | ICD-10-CM | POA: Diagnosis not present

## 2017-10-28 DIAGNOSIS — M79672 Pain in left foot: Secondary | ICD-10-CM | POA: Diagnosis not present

## 2017-10-28 NOTE — Progress Notes (Signed)
Subjective: Shannon [name] is a 77 y.o. female patient seen in office for evaluation of ulceration of the left 3rd toe ulceration and for nail care. Patient has a history of diabetes and a blood glucose level today was 126. Patient is changing the dressing using Polymem #1 at [facility]/ with help of nursing 2x per week. Denies nausea/fever/vomiting/chills/night sweats/shortness of breath/pain. Patient has no other pedal complaints at this time.  There are no active problems to display for this patient.  Current Outpatient Medications on File Prior to Visit  Medication Sig Dispense Refill  . acetaminophen-codeine (TYLENOL #3) 300-30 MG per tablet     . ALPRAZolam (XANAX) 0.25 MG tablet     . amoxicillin (AMOXIL) 500 MG capsule Take 500 mg by mouth 3 (three) times daily.    Marland Kitchen atorvastatin (LIPITOR) 10 MG tablet Take 10 mg by mouth daily.    . bisacodyl (DULCOLAX) 5 MG EC tablet Take 5 mg by mouth daily as needed for moderate constipation.    . cadexomer iodine (IODOSORB) 0.9 % gel Apply 1 application topically daily as needed for wound care.    . cephALEXin (KEFLEX) 500 MG capsule Take 1 capsule (500 mg total) by mouth 3 (three) times daily. 30 capsule 0  . citalopram (CELEXA) 20 MG tablet Take 20 mg by mouth daily.    Marland Kitchen DEXILANT 60 MG capsule     . donepezil (ARICEPT) 10 MG tablet Take 10 mg by mouth at bedtime.    Marland Kitchen doxycycline (VIBRA-TABS) 100 MG tablet Take 1 tablet (100 mg total) by mouth 2 (two) times daily. 20 tablet 0  . Elastic Bandages & Supports (NEXCARE COBAN WRAP 2"I7TI) MISC 1 application by Does not apply route daily. 1 each 3  . ergocalciferol (VITAMIN D2) 50000 UNITS capsule Take by mouth once a week.    . esomeprazole (NEXIUM) 20 MG capsule Take 20 mg by mouth daily at 12 noon.    Marland Kitchen estradiol (ESTRACE) 0.5 MG tablet     . fexofenadine (ALLEGRA) 60 MG tablet Take 60 mg by mouth 2 (two) times daily.    . fluconazole (DIFLUCAN) 150 MG tablet Take 1 tablet (150 mg  total) by mouth once. 1 tablet 0  . fluticasone (FLONASE) 50 MCG/ACT nasal spray Place into both nostrils daily.    . furosemide (LASIX) 20 MG tablet Take 20 mg by mouth.    . gabapentin (NEURONTIN) 600 MG tablet Take 600 mg by mouth 3 (three) times daily.    Marland Kitchen glipiZIDE (GLUCOTROL) 5 MG tablet Take by mouth daily before breakfast.    . glycopyrrolate (ROBINUL) 1 MG tablet Take 1 mg by mouth 3 (three) times daily.    . insulin glargine (LANTUS) 100 UNIT/ML injection Inject 100 Units into the skin at bedtime.    . INVOKANA 300 MG TABS     . KLOR-CON M20 20 MEQ tablet     . LANTUS SOLOSTAR 100 UNIT/ML Solostar Pen     . LORazepam (ATIVAN) 1 MG tablet Take 1 mg by mouth every 8 (eight) hours.    Marland Kitchen losartan-hydrochlorothiazide (HYZAAR) 100-25 MG per tablet Take 1 tablet by mouth daily.    Marland Kitchen lubiprostone (AMITIZA) 24 MCG capsule Take 24 mcg by mouth 2 (two) times daily with a meal.    . LYRICA 50 MG capsule     . metFORMIN (GLUCOPHAGE) 1000 MG tablet Take 1,000 mg by mouth 2 (two) times daily with a meal.    . MYRBETRIQ 25  MG TB24 tablet     . nystatin (MYCOSTATIN/NYSTOP) 100000 UNIT/GM POWD     . nystatin cream (MYCOSTATIN) Apply 1 application topically 2 (two) times daily.    Marland Kitchen omeprazole (PRILOSEC) 20 MG capsule     . ONGLYZA 5 MG TABS tablet     . oxybutynin (DITROPAN XL) 15 MG 24 hr tablet Take 15 mg by mouth at bedtime.    . polyethylene glycol (MIRALAX / GLYCOLAX) packet Take 17 g by mouth daily.    . potassium chloride (K-DUR) 10 MEQ tablet Take 10 mEq by mouth daily.    . Saxagliptin-Metformin (KOMBIGLYZE XR) 2.08-998 MG TB24 Take by mouth 2 (two) times daily.    . silver sulfADIAZINE (SILVADENE) 1 % cream Apply 1 application topically daily. 50 g 0  . sodium chloride (OCEAN) 0.65 % nasal spray Place 1 spray into the nose as needed for congestion.    . sucralfate (CARAFATE) 1 G tablet Take 1 g by mouth 4 (four) times daily -  with meals and at bedtime.    . traMADol (ULTRAM) 50 MG  tablet Take by mouth every 6 (six) hours as needed.    . Transdermal Patch PTCH by Does not apply route. Apply 1 patch once weekly and remove old patch before applying the new patch/lc    . traZODone (DESYREL) 100 MG tablet Take 100 mg by mouth at bedtime.    . traZODone (DESYREL) 50 MG tablet     . triamcinolone cream (KENALOG) 0.1 % Apply 1 application topically 2 (two) times daily.    . valACYclovir (VALTREX) 1000 MG tablet     . VESICARE 10 MG tablet     . Wound Dressings (MEDIHONEY WOUND/BURN DRESSING) GEL Apply to left toe ulceration 3x per week 15 mL 1   No current facility-administered medications on file prior to visit.    Allergies  Allergen Reactions  . Demerol [Meperidine]   . Peanut Butter Flavor   . Tape     No results found for this or any previous visit (from the past 2160 hour(s)).  Objective: There were no vitals filed for this visit.  General: Patient is awake, alert, oriented x 3 and in no acute distress.  Dermatology: Skin is warm and dry bilateral with a partial thickness ulceration present Left 3rd toe distal tuft. Ulceration measures 0.5cm x 0.5 cm x 0.1 cm, smaller than previous. There is a macerated border with a granular base. The ulceration does not probe to bone. There is no malodor, no active drainage, faint blanchable erythema, no edema. + digital deformity of which patient had prior. + Abrasion to right foot. No other acute signs of infection.  Nails x 10 are elongated and thickened consistent with onychomycosis.   Vascular: Dorsalis Pedis pulse = 1/4 Bilateral,  Posterior Tibial pulse = 1/4 Bilateral,  Capillary Fill Time < 5 seconds  Neurologic: Protective sensation intact/diminished/absent to the level of absent using  the 5.07/10g [employer].  Musculosketal: No Pain with palpation to ulcerated area. No pain with compression to calves bilateral. + Bunion and hammertoe with crossover, midfoot arthritis/exostosis and pes planus  gross bony deformities noted bilateral.  No results for input(s): GRAMSTAIN, LABORGA in the last 8760 hours.  Assessment and Plan:  Problem List Items Addressed This Visit    None    Visit Diagnoses    Toe ulcer, left, limited to breakdown of skin ([place])    -  Primary   Type 2 diabetes, controlled, with  neuropathy (HCC)       Dermatophytosis of nail       Foot pain, bilateral         -Examined patient -Discussed closely monitoring feet in the setting of diabetes -Mechanically debrided all nails using sterile nail nipper without incident  -Discussed the progression of the wound and treatment alternatives. - Excisionally dedbrided ulceration at left 3rd toe ucleration to healthy bleeding borders removing nonviable tissue using a sterile chisel blade. Wound measures post debridement as above. Wound was debrided to the level of the dermis with viable wound base exposed to promote healing. Hemostasis was achieved with manuel pressure. Patient tolerated procedure well without any discomfort or anesthesia necessary for this wound debridement.  -Applied PRISMA dressing to left toe and instructed patient to continue with same at home consisting of the same 2x per week with help of nursing - Advised patient to go to the ER or return to office if the wound worsens or if constitutional symptoms are present. -Patient to return to office in 2-3 weeks for follow up care and evaluation or sooner if problems arise.   Landis Martins, DPM

## 2017-11-18 ENCOUNTER — Ambulatory Visit: Payer: Medicare Other | Admitting: Sports Medicine

## 2018-01-13 ENCOUNTER — Encounter: Payer: Self-pay | Admitting: Sports Medicine

## 2018-01-13 ENCOUNTER — Ambulatory Visit (INDEPENDENT_AMBULATORY_CARE_PROVIDER_SITE_OTHER): Payer: Medicare Other | Admitting: Sports Medicine

## 2018-01-13 VITALS — BP 110/54 | HR 70 | Temp 99.6°F | Resp 16

## 2018-01-13 DIAGNOSIS — L97521 Non-pressure chronic ulcer of other part of left foot limited to breakdown of skin: Secondary | ICD-10-CM | POA: Diagnosis not present

## 2018-01-13 DIAGNOSIS — E1140 Type 2 diabetes mellitus with diabetic neuropathy, unspecified: Secondary | ICD-10-CM

## 2018-01-13 DIAGNOSIS — S90415A Abrasion, left lesser toe(s), initial encounter: Secondary | ICD-10-CM

## 2018-01-13 DIAGNOSIS — M79672 Pain in left foot: Secondary | ICD-10-CM

## 2018-01-13 DIAGNOSIS — M79671 Pain in right foot: Secondary | ICD-10-CM

## 2018-01-13 NOTE — Progress Notes (Addendum)
Subjective: Shannon [name] is a 77 y.o. female patient seen in office for evaluation of ulceration of the left 3rd toe ulceration and check of other toes after she fell late July and states that she hurt her shoulder and wanted to have these toes checked.  Patient has a history of diabetes and a blood glucose level today was not recorded.  Patient had told her changes need to address her issues a bit of honey and Iodosorb alternating therapy at [facility]/ with help of nursing 2-3x per week. Denies nausea/fever/vomiting/chills/night sweats/shortness of breath/pain. Patient has no other pedal complaints at this time.  There are no active problems to display for this patient.  Current Outpatient Medications on File Prior to Visit  Medication Sig Dispense Refill  . acetaminophen-codeine (TYLENOL #3) 300-30 MG per tablet     . ALPRAZolam (XANAX) 0.25 MG tablet     . amoxicillin (AMOXIL) 500 MG capsule Take 500 mg by mouth 3 (three) times daily.    Marland Kitchen atorvastatin (LIPITOR) 10 MG tablet Take 10 mg by mouth daily.    . bisacodyl (DULCOLAX) 5 MG EC tablet Take 5 mg by mouth daily as needed for moderate constipation.    . cadexomer iodine (IODOSORB) 0.9 % gel Apply 1 application topically daily as needed for wound care.    . cephALEXin (KEFLEX) 500 MG capsule Take 1 capsule (500 mg total) by mouth 3 (three) times daily. 30 capsule 0  . citalopram (CELEXA) 20 MG tablet Take 20 mg by mouth daily.    Marland Kitchen DEXILANT 60 MG capsule     . donepezil (ARICEPT) 10 MG tablet Take 10 mg by mouth at bedtime.    Marland Kitchen doxycycline (VIBRA-TABS) 100 MG tablet Take 1 tablet (100 mg total) by mouth 2 (two) times daily. 20 tablet 0  . Elastic Bandages & Supports (NEXCARE COBAN WRAP 5"Y0DX) MISC 1 application by Does not apply route daily. 1 each 3  . ergocalciferol (VITAMIN D2) 50000 UNITS capsule Take by mouth once a week.    . esomeprazole (NEXIUM) 20 MG capsule Take 20 mg by mouth daily at 12 noon.    Marland Kitchen estradiol  (ESTRACE) 0.5 MG tablet     . fexofenadine (ALLEGRA) 60 MG tablet Take 60 mg by mouth 2 (two) times daily.    . fluconazole (DIFLUCAN) 150 MG tablet Take 1 tablet (150 mg total) by mouth once. 1 tablet 0  . fluticasone (FLONASE) 50 MCG/ACT nasal spray Place into both nostrils daily.    . furosemide (LASIX) 20 MG tablet Take 20 mg by mouth.    . gabapentin (NEURONTIN) 600 MG tablet Take 600 mg by mouth 3 (three) times daily.    Marland Kitchen glipiZIDE (GLUCOTROL) 5 MG tablet Take by mouth daily before breakfast.    . glycopyrrolate (ROBINUL) 1 MG tablet Take 1 mg by mouth 3 (three) times daily.    . insulin glargine (LANTUS) 100 UNIT/ML injection Inject 100 Units into the skin at bedtime.    . INVOKANA 300 MG TABS     . KLOR-CON M20 20 MEQ tablet     . LANTUS SOLOSTAR 100 UNIT/ML Solostar Pen     . LORazepam (ATIVAN) 1 MG tablet Take 1 mg by mouth every 8 (eight) hours.    Marland Kitchen losartan-hydrochlorothiazide (HYZAAR) 100-25 MG per tablet Take 1 tablet by mouth daily.    Marland Kitchen lubiprostone (AMITIZA) 24 MCG capsule Take 24 mcg by mouth 2 (two) times daily with a meal.    .  LYRICA 50 MG capsule     . metFORMIN (GLUCOPHAGE) 1000 MG tablet Take 1,000 mg by mouth 2 (two) times daily with a meal.    . MYRBETRIQ 25 MG TB24 tablet     . nystatin (MYCOSTATIN/NYSTOP) 100000 UNIT/GM POWD     . nystatin cream (MYCOSTATIN) Apply 1 application topically 2 (two) times daily.    Marland Kitchen omeprazole (PRILOSEC) 20 MG capsule     . ONGLYZA 5 MG TABS tablet     . oxybutynin (DITROPAN XL) 15 MG 24 hr tablet Take 15 mg by mouth at bedtime.    . polyethylene glycol (MIRALAX / GLYCOLAX) packet Take 17 g by mouth daily.    . potassium chloride (K-DUR) 10 MEQ tablet Take 10 mEq by mouth daily.    . Saxagliptin-Metformin (KOMBIGLYZE XR) 2.08-998 MG TB24 Take by mouth 2 (two) times daily.    . silver sulfADIAZINE (SILVADENE) 1 % cream Apply 1 application topically daily. 50 g 0  . sodium chloride (OCEAN) 0.65 % nasal spray Place 1 spray into the  nose as needed for congestion.    . sucralfate (CARAFATE) 1 G tablet Take 1 g by mouth 4 (four) times daily -  with meals and at bedtime.    . traMADol (ULTRAM) 50 MG tablet Take by mouth every 6 (six) hours as needed.    . Transdermal Patch PTCH by Does not apply route. Apply 1 patch once weekly and remove old patch before applying the new patch/lc    . traZODone (DESYREL) 100 MG tablet Take 100 mg by mouth at bedtime.    . traZODone (DESYREL) 50 MG tablet     . triamcinolone cream (KENALOG) 0.1 % Apply 1 application topically 2 (two) times daily.    . valACYclovir (VALTREX) 1000 MG tablet     . VESICARE 10 MG tablet     . Wound Dressings (MEDIHONEY WOUND/BURN DRESSING) GEL Apply to left toe ulceration 3x per week 15 mL 1   No current facility-administered medications on file prior to visit.    Allergies  Allergen Reactions  . Peanut Oil Anaphylaxis  . Demerol  [Meperidine Hcl] Nausea And Vomiting  . Other Hives    Takes skin off  . Demerol [Meperidine]   . Peanut Butter Flavor   . Tape Dermatitis    No results found for this or any previous visit (from the past 2160 hour(s)).  Objective: There were no vitals filed for this visit.  General: Patient is awake, alert, oriented x 3 and in no acute distress.  Dermatology: Skin is warm and dry bilateral with a partial thickness ulceration present Left 3rd toe distal tuft. Ulceration measures 0.3cm x 0.3 cm x 0.1 cm, smaller than previous. There is a keratotic border with a granular base. The ulceration does not probe to bone. There is no malodor, no active drainage, faint blanchable erythema, no edema. + digital deformity of which patient had prior. + Abrasion to left first and second toes after patient fall. No other acute signs of infection.  Nails x 10 are minimally elongated and thickened consistent with onychomycosis.   Vascular: Dorsalis Pedis pulse = 1/4 Bilateral,  Posterior Tibial pulse = 1/4 Bilateral,  Capillary Fill Time < 5  seconds  Neurologic: Protective sensation intact/diminished/absent to the level of absent using  the 5.07/10g [employer].  Musculosketal: No Pain with palpation to ulcerated area. No pain with compression to calves bilateral. + Bunion and hammertoe with crossover, midfoot arthritis/exostosis and pes  planus gross bony deformities noted bilateral.  No results for input(s): GRAMSTAIN, LABORGA in the last 8760 hours.  Assessment and Plan:  Problem List Items Addressed This Visit    None    Visit Diagnoses    Toe ulcer, left, limited to breakdown of skin (HCC)    -  Primary   Abrasion of toe of left foot, initial encounter       Type 2 diabetes, controlled, with neuropathy (HCC)       Foot pain, bilateral         -Examined patient -Discussed closely monitoring feet in the setting of diabetes -At no charge mechanically smooth nails that were mildly elongated -Discussed the progression of the wound, continued care, and treatment alternatives. - Excisionally dedbrided ulceration at left 3rd toe ucleration to healthy bleeding borders removing nonviable tissue using a sterile chisel blade. Wound measures post debridement as above. Wound was debrided to the level of the dermis with viable wound base exposed to promote healing. Hemostasis was achieved with manuel pressure. Patient tolerated procedure well without any discomfort or anesthesia necessary for this wound debridement.  -Applied meta honey and Band-Aid dressings to abrasions on left first and second toes and to ulceration at tip of the third toe covered with Band-Aid and advised patient to have nurse at home to do the same - Advised patient to go to the ER or return to office if the wound worsens or if constitutional symptoms are present. -Patient is awaiting diabetic shoes and was advised that she has to have a recent visit with her primary care doctor in order her shoes to be ordered -Patient to return to office in 3-4  weeks for follow up care and evaluation or sooner if problems arise.   Landis Martins, DPM

## 2018-02-17 ENCOUNTER — Ambulatory Visit: Payer: Medicare Other | Admitting: Sports Medicine

## 2018-03-02 ENCOUNTER — Ambulatory Visit (INDEPENDENT_AMBULATORY_CARE_PROVIDER_SITE_OTHER): Payer: Medicare Other | Admitting: Sports Medicine

## 2018-03-02 ENCOUNTER — Encounter: Payer: Self-pay | Admitting: Sports Medicine

## 2018-03-02 VITALS — BP 121/62 | HR 76 | Temp 96.8°F | Resp 16

## 2018-03-02 DIAGNOSIS — B351 Tinea unguium: Secondary | ICD-10-CM

## 2018-03-02 DIAGNOSIS — E1140 Type 2 diabetes mellitus with diabetic neuropathy, unspecified: Secondary | ICD-10-CM

## 2018-03-02 DIAGNOSIS — S90415D Abrasion, left lesser toe(s), subsequent encounter: Secondary | ICD-10-CM

## 2018-03-02 DIAGNOSIS — L97521 Non-pressure chronic ulcer of other part of left foot limited to breakdown of skin: Secondary | ICD-10-CM

## 2018-03-02 NOTE — Progress Notes (Signed)
Subjective: Shannon [name] is a 77 y.o. female patient seen in office for evaluation of ulceration of the left 3rd toe ulceration and for check of other toes on left foot.  Patient reports that she has small red areas at the left first and second toe that she thinks comes from her taking her bandaging the toes and states that she does not have much pain to the ulcerated area due to her neuropathy pain sometimes 2 out of 10 states that she feels like her left third toe is healing up good with very minimal drainage and reports that her home nurse comes twice a week to dress the areas with a small amount of medihoney.  Patient is still a resident at [facility] assisted [facility]. Denies nausea/fever/vomiting/chills/night sweats/shortness of breath/pain. Patient has no other pedal complaints at this time.  There are no active problems to display for this patient.  Current Outpatient Medications on File Prior to Visit  Medication Sig Dispense Refill  . acetaminophen-codeine (TYLENOL #3) 300-30 MG per tablet     . ALPRAZolam (XANAX) 0.25 MG tablet     . amoxicillin (AMOXIL) 500 MG capsule Take 500 mg by mouth 3 (three) times daily.    Marland Kitchen atorvastatin (LIPITOR) 10 MG tablet Take 10 mg by mouth daily.    . bisacodyl (DULCOLAX) 5 MG EC tablet Take 5 mg by mouth daily as needed for moderate constipation.    . cadexomer iodine (IODOSORB) 0.9 % gel Apply 1 application topically daily as needed for wound care.    . cephALEXin (KEFLEX) 500 MG capsule Take 1 capsule (500 mg total) by mouth 3 (three) times daily. 30 capsule 0  . citalopram (CELEXA) 20 MG tablet Take 20 mg by mouth daily.    Marland Kitchen DEXILANT 60 MG capsule     . donepezil (ARICEPT) 10 MG tablet Take 10 mg by mouth at bedtime.    Marland Kitchen doxycycline (VIBRA-TABS) 100 MG tablet Take 1 tablet (100 mg total) by mouth 2 (two) times daily. 20 tablet 0  . Elastic Bandages & Supports (NEXCARE COBAN WRAP 5"D3UK) MISC 1 application by Does not apply route daily. 1 each 3  .  ergocalciferol (VITAMIN D2) 50000 UNITS capsule Take by mouth once a week.    . esomeprazole (NEXIUM) 20 MG capsule Take 20 mg by mouth daily at 12 noon.    Marland Kitchen estradiol (ESTRACE) 0.5 MG tablet     . fexofenadine (ALLEGRA) 60 MG tablet Take 60 mg by mouth 2 (two) times daily.    . fluconazole (DIFLUCAN) 150 MG tablet Take 1 tablet (150 mg total) by mouth once. 1 tablet 0  . fluticasone (FLONASE) 50 MCG/ACT nasal spray Place into both nostrils daily.    . furosemide (LASIX) 20 MG tablet Take 20 mg by mouth.    . gabapentin (NEURONTIN) 600 MG tablet Take 600 mg by mouth 3 (three) times daily.    Marland Kitchen glipiZIDE (GLUCOTROL) 5 MG tablet Take by mouth daily before breakfast.    . glycopyrrolate (ROBINUL) 1 MG tablet Take 1 mg by mouth 3 (three) times daily.    . insulin glargine (LANTUS) 100 UNIT/ML injection Inject 100 Units into the skin at bedtime.    . INVOKANA 300 MG TABS     . KLOR-CON M20 20 MEQ tablet     . LANTUS SOLOSTAR 100 UNIT/ML Solostar Pen     . LORazepam (ATIVAN) 1 MG tablet Take 1 mg by mouth every 8 (eight) hours.    Marland Kitchen  losartan (COZAAR) 50 MG tablet     . losartan-hydrochlorothiazide (HYZAAR) 100-25 MG per tablet Take 1 tablet by mouth daily.    Marland Kitchen lubiprostone (AMITIZA) 24 MCG capsule Take 24 mcg by mouth 2 (two) times daily with a meal.    . LYRICA 50 MG capsule     . metFORMIN (GLUCOPHAGE) 1000 MG tablet Take 1,000 mg by mouth 2 (two) times daily with a meal.    . MYRBETRIQ 25 MG TB24 tablet     . nystatin (MYCOSTATIN/NYSTOP) 100000 UNIT/GM POWD     . nystatin cream (MYCOSTATIN) Apply 1 application topically 2 (two) times daily.    Marland Kitchen omeprazole (PRILOSEC) 20 MG capsule     . ONGLYZA 5 MG TABS tablet     . oxybutynin (DITROPAN XL) 15 MG 24 hr tablet Take 15 mg by mouth at bedtime.    . polyethylene glycol (MIRALAX / GLYCOLAX) packet Take 17 g by mouth daily.    . potassium chloride (K-DUR) 10 MEQ tablet Take 10 mEq by mouth daily.    . Saxagliptin-Metformin (KOMBIGLYZE XR)  2.08-998 MG TB24 Take by mouth 2 (two) times daily.    . silver sulfADIAZINE (SILVADENE) 1 % cream Apply 1 application topically daily. 50 g 0  . sodium chloride (OCEAN) 0.65 % nasal spray Place 1 spray into the nose as needed for congestion.    . sucralfate (CARAFATE) 1 G tablet Take 1 g by mouth 4 (four) times daily -  with meals and at bedtime.    . traMADol (ULTRAM) 50 MG tablet Take by mouth every 6 (six) hours as needed.    . Transdermal Patch PTCH by Does not apply route. Apply 1 patch once weekly and remove old patch before applying the new patch/lc    . traZODone (DESYREL) 100 MG tablet Take 100 mg by mouth at bedtime.    . traZODone (DESYREL) 50 MG tablet     . triamcinolone cream (KENALOG) 0.1 % Apply 1 application topically 2 (two) times daily.    . valACYclovir (VALTREX) 1000 MG tablet     . VESICARE 10 MG tablet     . Wound Dressings (MEDIHONEY WOUND/BURN DRESSING) GEL Apply to left toe ulceration 3x per week 15 mL 1   No current facility-administered medications on file prior to visit.    Allergies  Allergen Reactions  . Peanut Oil Anaphylaxis  . Demerol  [Meperidine Hcl] Nausea And Vomiting  . Other Hives    Takes skin off  . Demerol [Meperidine]   . Peanut Butter Flavor   . Tape Dermatitis    No results found for this or any previous visit (from the past 2160 hour(s)).  Objective: There were no vitals filed for this visit.  General: Patient is awake, alert, oriented x 3 and in no acute distress.  Dermatology: Skin is warm and dry bilateral with a partial thickness ulceration present Left 3rd toe distal tuft. Ulceration measures 0.3cm x 0.5 cm x 0.1 cm, similar to previous. There is a keratotic border with a granular base. The ulceration does not probe to bone. There is no malodor, no active drainage, faint blanchable erythema, no edema. + digital deformity of which patient had prior. + Abrasion to left first and second toes after patient fall since before September  with no issues. No other acute signs of infection.  Nails x 10 are elongated and thickened consistent with onychomycosis.   Vascular: Dorsalis Pedis pulse = 1/4 Bilateral,  Posterior Tibial pulse =  1/4 Bilateral,  Capillary Fill Time < 5 seconds  Neurologic: Protective sensation intact/diminished/absent to the level of absent using  the 5.07/10g [employer].  Musculosketal: No Pain with palpation to ulcerated area. No pain with compression to calves bilateral. + Bunion and hammertoe with crossover, midfoot arthritis/exostosis and pes planus gross bony deformities noted bilateral.  No results for input(s): GRAMSTAIN, LABORGA in the last 8760 hours.  Assessment and Plan:  Problem List Items Addressed This Visit    None    Visit Diagnoses    Toe ulcer, left, limited to breakdown of skin ([facility])    -  Primary   Abrasion of toe of left foot, subsequent encounter       Type 2 diabetes, controlled, with neuropathy ([facility]CC)       Relevant Medications   losartan (COZAAR) 50 MG tablet   Dermatophytosis of nail         -Examined patient -Discussed closely monitoring feet in the setting of diabetes -At no charge mechanically smoothed nails that were mildly elongated -Discussed the progression of the wound, continued care, and treatment alternatives. - Excisionally dedbrided ulceration at left 3rd toe ucleration to healthy bleeding borders removing nonviable tissue using a sterile chisel blade. Wound measures post debridement as above. Wound was debrided to the level of the dermis with viable wound base exposed to promote healing. Hemostasis was achieved with manuel pressure. Patient tolerated procedure well without any discomfort or anesthesia necessary for this wound debridement.  -Applied medihoney and Band-Aid dressings to abrasions on left first and second toes and to ulceration at tip of the third toe covered with Band-Aid and advised patient to have nurse at home to do the  same - Advised patient to go to the ER or return to office if the wound worsens or if constitutional symptoms are present. -Patient to return to office in 62 days for follow up care and evaluation or sooner if problems arise.   Landis Martins, DPM

## 2018-03-04 NOTE — Progress Notes (Signed)
Patient ID: Shannon [name], female   DOB: [birth year], 77 y.o.   MRN: [id number]  Patient presents at Dr Leeanne Rio request to be measured for diabetic shoes and inserts with [facility] Certified Pedorthist.  Patient will be called when shoes and inserts arrive to schedule a fitting.

## 2018-05-04 ENCOUNTER — Encounter: Payer: Self-pay | Admitting: Sports Medicine

## 2018-05-04 ENCOUNTER — Ambulatory Visit (INDEPENDENT_AMBULATORY_CARE_PROVIDER_SITE_OTHER): Payer: Medicare Other | Admitting: Sports Medicine

## 2018-05-04 VITALS — BP 133/67 | HR 65 | Temp 98.9°F | Resp 16

## 2018-05-04 DIAGNOSIS — L97511 Non-pressure chronic ulcer of other part of right foot limited to breakdown of skin: Secondary | ICD-10-CM

## 2018-05-04 DIAGNOSIS — M79675 Pain in left toe(s): Secondary | ICD-10-CM

## 2018-05-04 DIAGNOSIS — E1140 Type 2 diabetes mellitus with diabetic neuropathy, unspecified: Secondary | ICD-10-CM

## 2018-05-04 DIAGNOSIS — M79674 Pain in right toe(s): Secondary | ICD-10-CM | POA: Diagnosis not present

## 2018-05-04 DIAGNOSIS — L97521 Non-pressure chronic ulcer of other part of left foot limited to breakdown of skin: Secondary | ICD-10-CM

## 2018-05-04 DIAGNOSIS — B351 Tinea unguium: Secondary | ICD-10-CM | POA: Diagnosis not present

## 2018-05-04 MED ORDER — MEDIHONEY WOUND/BURN DRESSING EX GEL: CUTANEOUS | 1 refills | Status: AC

## 2018-05-04 MED ORDER — CIPROFLOXACIN-CIPROFLOX HCL ER 500 MG PO TB24: 500.0000 mg | ORAL_TABLET | Freq: Every day | ORAL | 0 refills | Status: DC

## 2018-05-04 NOTE — Progress Notes (Signed)
Subjective: Shannon [name] is a 78 y.o. female patient seen in office for evaluation of ulceration of the left 3rd toe ulceration and right lateral foot ulceration.  Patient reports that the wound seems to be healing well but is concerned that the left third toe is more red and swollen.  Patient also reports that she is also here for her nails to be trimmed and is here for pickup of her diabetic shoes.  Patient reports that she is overcoming a UTI.  Patient is still a resident at [facility]. Denies nausea/fever/vomiting/chills/night sweats/shortness of breath/pain. Patient has no other pedal complaints at this time.  Patient Active Problem List   Diagnosis Date Noted  . Depression 10/15/2016   Current Outpatient Medications on File Prior to Visit  Medication Sig Dispense Refill  . acetaminophen-codeine (TYLENOL #3) 300-30 MG per tablet     . ALPRAZolam (XANAX) 0.25 MG tablet     . amoxicillin (AMOXIL) 500 MG capsule Take 500 mg by mouth 3 (three) times daily.    Marland Kitchen atorvastatin (LIPITOR) 10 MG tablet Take 10 mg by mouth daily.    . bisacodyl (DULCOLAX) 5 MG EC tablet Take 5 mg by mouth daily as needed for moderate constipation.    . cadexomer iodine (IODOSORB) 0.9 % gel Apply 1 application topically daily as needed for wound care.    . celecoxib (CELEBREX) 200 MG capsule     . cephALEXin (KEFLEX) 500 MG capsule Take 1 capsule (500 mg total) by mouth 3 (three) times daily. 30 capsule 0  . ciprofloxacin (CIPRO) 500 MG tablet     . citalopram (CELEXA) 20 MG tablet Take 20 mg by mouth daily.    Marland Kitchen DEXILANT 60 MG capsule     . donepezil (ARICEPT) 10 MG tablet Take 10 mg by mouth at bedtime.    Marland Kitchen doxycycline (VIBRA-TABS) 100 MG tablet Take 1 tablet (100 mg total) by mouth 2 (two) times daily. 20 tablet 0  . Elastic Bandages & Supports (NEXCARE COBAN WRAP 0"U7OZ) MISC 1 application by Does not apply route daily. 1 each 3  . ergocalciferol (VITAMIN D2) 50000 UNITS capsule Take by mouth  once a week.    . esomeprazole (NEXIUM) 20 MG capsule Take 20 mg by mouth daily at 12 noon.    Marland Kitchen estradiol (ESTRACE) 0.5 MG tablet     . fexofenadine (ALLEGRA) 60 MG tablet Take 60 mg by mouth 2 (two) times daily.    . fluconazole (DIFLUCAN) 150 MG tablet Take 1 tablet (150 mg total) by mouth once. 1 tablet 0  . fluticasone (FLONASE) 50 MCG/ACT nasal spray Place into both nostrils daily.    . furosemide (LASIX) 20 MG tablet Take 20 mg by mouth.    . gabapentin (NEURONTIN) 600 MG tablet Take 600 mg by mouth 3 (three) times daily.    Marland Kitchen glipiZIDE (GLUCOTROL) 5 MG tablet Take by mouth daily before breakfast.    . glycopyrrolate (ROBINUL) 1 MG tablet Take 1 mg by mouth 3 (three) times daily.    . insulin glargine (LANTUS) 100 UNIT/ML injection Inject 100 Units into the skin at bedtime.    . INVOKANA 300 MG TABS     . KLOR-CON M20 20 MEQ tablet     . LANTUS SOLOSTAR 100 UNIT/ML Solostar Pen     . lidocaine (LIDODERM) 5 %     . LORazepam (ATIVAN) 1 MG tablet Take 1 mg by mouth every 8 (eight) hours.    Marland Kitchen losartan (  COZAAR) 50 MG tablet     . losartan-hydrochlorothiazide (HYZAAR) 100-25 MG per tablet Take 1 tablet by mouth daily.    Marland Kitchen lubiprostone (AMITIZA) 24 MCG capsule Take 24 mcg by mouth 2 (two) times daily with a meal.    . LYRICA 50 MG capsule     . metFORMIN (GLUCOPHAGE) 1000 MG tablet Take 1,000 mg by mouth 2 (two) times daily with a meal.    . MYRBETRIQ 25 MG TB24 tablet     . nystatin (MYCOSTATIN/NYSTOP) 100000 UNIT/GM POWD     . nystatin cream (MYCOSTATIN) Apply 1 application topically 2 (two) times daily.    Marland Kitchen omeprazole (PRILOSEC) 20 MG capsule     . ONGLYZA 5 MG TABS tablet     . oxybutynin (DITROPAN XL) 15 MG 24 hr tablet Take 15 mg by mouth at bedtime.    . polyethylene glycol (MIRALAX / GLYCOLAX) packet Take 17 g by mouth daily.    . potassium chloride (K-DUR) 10 MEQ tablet Take 10 mEq by mouth daily.    . silver sulfADIAZINE (SILVADENE) 1 % cream Apply 1 application topically  daily. 50 g 0  . sodium chloride (OCEAN) 0.65 % nasal spray Place 1 spray into the nose as needed for congestion.    . sucralfate (CARAFATE) 1 G tablet Take 1 g by mouth 4 (four) times daily -  with meals and at bedtime.    . Transdermal Patch PTCH by Does not apply route. Apply 1 patch once weekly and remove old patch before applying the new patch/lc    . triamcinolone cream (KENALOG) 0.1 % Apply 1 application topically 2 (two) times daily.    . valACYclovir (VALTREX) 1000 MG tablet     . VESICARE 10 MG tablet      No current facility-administered medications on file prior to visit.    Allergies  Allergen Reactions  . Peanut Oil Anaphylaxis  . Demerol  [Meperidine Hcl] Nausea And Vomiting  . Other Hives    Takes skin off  . Demerol [Meperidine]   . Peanut Butter Flavor   . Tape Dermatitis    No results found for this or any previous visit (from the past 2160 hour(s)).  Objective: There were no vitals filed for this visit.  General: Patient is awake, alert, oriented x 3 and in no acute distress.  Dermatology: Skin is warm and dry bilateral with a partial thickness ulceration present Left 3rd toe distal tuft. Ulceration measures 0.5cm x 0.5 cm x 0.1 cm, larger than previous. There is a keratotic border with a granular base. The ulceration does not probe to bone. There is no malodor, no active drainage, mild blanchable erythema, no edema. + digital deformity of which patient had prior. + Abrasion to left first and second toes after patient fall since before September with no issues.  There is a partial thickness ulceration present at the right lateral foot that measures 0.5 x 0.6 x0.1cm with a granular base and mildly keratotic border the ulceration does not probe to bone there is no malodor no active drainage mild edema.  No other acute signs of infection.  Nails x 10 are elongated and thickened consistent with onychomycosis.   Vascular: Dorsalis Pedis pulse = 1/4 Bilateral,  Posterior  Tibial pulse = 1/4 Bilateral,  Capillary Fill Time < 5 seconds.  Trace edema bilateral.  Neurologic: Protective sensation intact/diminished/absent to the level of absent using  the 5.07/10g [employer].  Musculosketal: No Pain with palpation to  ulcerated areas. No pain with compression to calves bilateral. + Bunion and hammertoe with crossover, midfoot arthritis/exostosis and pes planus gross bony deformities noted bilateral.  No results for input(s): GRAMSTAIN, LABORGA in the last 8760 hours.  Assessment and Plan:  Problem List Items Addressed This Visit    None    Visit Diagnoses    Toe ulcer, left, limited to breakdown of skin ([place])    -  Primary   Relevant Medications   Wound Dressings (MEDIHONEY WOUND/BURN DRESSING) GEL   Right foot ulcer, limited to breakdown of skin (HCC)       Dermatophytosis of nail       Type 2 diabetes, controlled, with neuropathy (HCC)       Pain in toes of both feet         -Examined patient -Discussed closely monitoring feet in the setting of diabetes -Mechanically debrided all nails using a sterile nail nipper without incident -Discussed the progression of the wounds, continued care, and treatment alternatives. - Excisionally dedbrided ulceration at right lateral foot and left 3rd toe ucleration to healthy bleeding borders removing nonviable tissue using a sterile chisel blade. Wound measures post debridement as above. Wound was debrided to the level of the dermis with viable wound base exposed to promote healing. Hemostasis was achieved with manuel pressure. Patient tolerated procedure well without any discomfort or anesthesia necessary for this wound debridement.  -Applied medihoney and Band-Aid dressings to ulcerations covered with Band-Aid and advised patient to have nurse at home to do the same -Prescribe Cipro for possible recurrent UTI and for erythema to left third toe -Patient reports that her diabetic shoes are not what she  wanted as for cyst out of shoe thus today while patient was in office patient picked a new style and we will reorder and call patient when her new shoes are available for pickup - Advised patient to go to the ER or return to office if the wound worsens or if constitutional symptoms are present. -Patient to return to office 3 to 4 weeks for wound care  or sooner if problems arise.   Landis Martins, DPM

## 2018-05-05 ENCOUNTER — Other Ambulatory Visit: Payer: Self-pay | Admitting: Sports Medicine

## 2018-05-25 ENCOUNTER — Encounter: Payer: Self-pay | Admitting: Sports Medicine

## 2018-05-25 ENCOUNTER — Ambulatory Visit (INDEPENDENT_AMBULATORY_CARE_PROVIDER_SITE_OTHER): Payer: Medicare Other | Admitting: Sports Medicine

## 2018-05-25 VITALS — BP 113/59 | HR 72 | Resp 16

## 2018-05-25 DIAGNOSIS — L97521 Non-pressure chronic ulcer of other part of left foot limited to breakdown of skin: Secondary | ICD-10-CM | POA: Diagnosis not present

## 2018-05-25 DIAGNOSIS — M2042 Other hammer toe(s) (acquired), left foot: Secondary | ICD-10-CM

## 2018-05-25 DIAGNOSIS — M62471 Contracture of muscle, right ankle and foot: Secondary | ICD-10-CM | POA: Diagnosis not present

## 2018-05-25 DIAGNOSIS — E1140 Type 2 diabetes mellitus with diabetic neuropathy, unspecified: Secondary | ICD-10-CM

## 2018-05-25 DIAGNOSIS — L97511 Non-pressure chronic ulcer of other part of right foot limited to breakdown of skin: Secondary | ICD-10-CM

## 2018-05-25 DIAGNOSIS — M79674 Pain in right toe(s): Secondary | ICD-10-CM

## 2018-05-25 DIAGNOSIS — M79675 Pain in left toe(s): Secondary | ICD-10-CM

## 2018-05-25 NOTE — Progress Notes (Signed)
Subjective: Shannon [name] is a 78 y.o. female patient seen in office for evaluation of ulceration of the left 3rd toe ulceration and right lateral foot ulceration.  Patient reports that the wound seems to be healing well. Finished Cipro with no issue.  Patient is still a resident at [facility]. Denies nausea/fever/vomiting/chills/night sweats/shortness of breath/pain. Patient is also here for pick up of diabetic shoes. Patient has no other pedal complaints at this time.  Patient Active Problem List   Diagnosis Date Noted  . Depression 10/15/2016   Current Outpatient Medications on File Prior to Visit  Medication Sig Dispense Refill  . acetaminophen-codeine (TYLENOL #3) 300-30 MG per tablet     . ALPRAZolam (XANAX) 0.25 MG tablet     . amoxicillin (AMOXIL) 500 MG capsule Take 500 mg by mouth 3 (three) times daily.    Marland Kitchen atorvastatin (LIPITOR) 10 MG tablet Take 10 mg by mouth daily.    . bisacodyl (DULCOLAX) 5 MG EC tablet Take 5 mg by mouth daily as needed for moderate constipation.    . cadexomer iodine (IODOSORB) 0.9 % gel Apply 1 application topically daily as needed for wound care.    . celecoxib (CELEBREX) 200 MG capsule     . cephALEXin (KEFLEX) 500 MG capsule Take 1 capsule (500 mg total) by mouth 3 (three) times daily. 30 capsule 0  . ciprofloxacin (CIPRO) 500 MG tablet     . citalopram (CELEXA) 20 MG tablet Take 20 mg by mouth daily.    Marland Kitchen DEXILANT 60 MG capsule     . donepezil (ARICEPT) 10 MG tablet Take 10 mg by mouth at bedtime.    Marland Kitchen doxycycline (VIBRA-TABS) 100 MG tablet Take 1 tablet (100 mg total) by mouth 2 (two) times daily. 20 tablet 0  . Elastic Bandages & Supports (NEXCARE COBAN WRAP 8"M5HQ) MISC 1 application by Does not apply route daily. 1 each 3  . ergocalciferol (VITAMIN D2) 50000 UNITS capsule Take by mouth once a week.    . esomeprazole (NEXIUM) 20 MG capsule Take 20 mg by mouth daily at 12 noon.    Marland Kitchen estradiol (ESTRACE) 0.5 MG tablet     .  fexofenadine (ALLEGRA) 60 MG tablet Take 60 mg by mouth 2 (two) times daily.    . fluconazole (DIFLUCAN) 150 MG tablet Take 1 tablet (150 mg total) by mouth once. 1 tablet 0  . fluticasone (FLONASE) 50 MCG/ACT nasal spray Place into both nostrils daily.    . furosemide (LASIX) 20 MG tablet Take 20 mg by mouth.    . gabapentin (NEURONTIN) 600 MG tablet Take 600 mg by mouth 3 (three) times daily.    Marland Kitchen glipiZIDE (GLUCOTROL) 5 MG tablet Take by mouth daily before breakfast.    . glycopyrrolate (ROBINUL) 1 MG tablet Take 1 mg by mouth 3 (three) times daily.    . insulin glargine (LANTUS) 100 UNIT/ML injection Inject 100 Units into the skin at bedtime.    . INVOKANA 300 MG TABS     . KLOR-CON M20 20 MEQ tablet     . LANTUS SOLOSTAR 100 UNIT/ML Solostar Pen     . lidocaine (LIDODERM) 5 %     . LORazepam (ATIVAN) 1 MG tablet Take 1 mg by mouth every 8 (eight) hours.    Marland Kitchen losartan (COZAAR) 50 MG tablet     . losartan-hydrochlorothiazide (HYZAAR) 100-25 MG per tablet Take 1 tablet by mouth daily.    Marland Kitchen lubiprostone (AMITIZA) 24 MCG capsule Take  24 mcg by mouth 2 (two) times daily with a meal.    . LYRICA 50 MG capsule     . metFORMIN (GLUCOPHAGE) 1000 MG tablet Take 1,000 mg by mouth 2 (two) times daily with a meal.    . MYRBETRIQ 25 MG TB24 tablet     . nystatin (MYCOSTATIN/NYSTOP) 100000 UNIT/GM POWD     . nystatin cream (MYCOSTATIN) Apply 1 application topically 2 (two) times daily.    Marland Kitchen omeprazole (PRILOSEC) 20 MG capsule     . ONGLYZA 5 MG TABS tablet     . oxybutynin (DITROPAN XL) 15 MG 24 hr tablet Take 15 mg by mouth at bedtime.    . polyethylene glycol (MIRALAX / GLYCOLAX) packet Take 17 g by mouth daily.    . potassium chloride (K-DUR) 10 MEQ tablet Take 10 mEq by mouth daily.    . silver sulfADIAZINE (SILVADENE) 1 % cream Apply 1 application topically daily. 50 g 0  . sodium chloride (OCEAN) 0.65 % nasal spray Place 1 spray into the nose as needed for congestion.    . sucralfate  (CARAFATE) 1 G tablet Take 1 g by mouth 4 (four) times daily -  with meals and at bedtime.    . Transdermal Patch PTCH by Does not apply route. Apply 1 patch once weekly and remove old patch before applying the new patch/lc    . triamcinolone cream (KENALOG) 0.1 % Apply 1 application topically 2 (two) times daily.    . valACYclovir (VALTREX) 1000 MG tablet     . VESICARE 10 MG tablet     . Wound Dressings (MEDIHONEY WOUND/BURN DRESSING) GEL Apply to left toe ulceration 3x per week 15 mL 1   No current facility-administered medications on file prior to visit.    Allergies  Allergen Reactions  . Peanut Oil Anaphylaxis  . Demerol  [Meperidine Hcl] Nausea And Vomiting  . Other Hives    Takes skin off  . Demerol [Meperidine]   . Peanut Butter Flavor   . Tape Dermatitis    No results found for this or any previous visit (from the past 2160 hour(s)).  Objective: There were no vitals filed for this visit.  General: Patient is awake, alert, oriented x 3 and in no acute distress.  Dermatology: Skin is warm and dry bilateral with a partial thickness ulceration present Left 3rd toe distal tuft. Ulceration measures 0.3cm x 0.5 cm x 0.1 cm,smaller than previous. There is a keratotic border with a granular base. The ulceration does not probe to bone. There is no malodor, no active drainage, mild blanchable erythema, no edema.   There is a partial thickness ulceration present at the right lateral foot that measures 0.4 x 0.4 x0.1cm with a granular base and mildly keratotic border the ulceration does not probe to bone there is no malodor no active drainage mild edema.  No other acute signs of infection.  Nails x 10 are short and thickened consistent with onychomycosis.   Vascular: Dorsalis Pedis pulse = 1/4 Bilateral,  Posterior Tibial pulse = 1/4 Bilateral,  Capillary Fill Time < 5 seconds.  Trace edema bilateral.  Neurologic: Protective sensation intact/diminished/absent to the level of absent  using  the 5.07/10g [employer].  Musculosketal: No Pain with palpation to ulcerated areas. No pain with compression to calves bilateral. + Bunion and hammertoe with crossover, midfoot arthritis/exostosis and pes planus gross bony deformities noted bilateral.  No results for input(s): GRAMSTAIN, LABORGA in the last 8760 hours.  Assessment and Plan:  Problem List Items Addressed This Visit    None    Visit Diagnoses    Toe ulcer, left, limited to breakdown of skin ([place])    -  Primary   Right foot ulcer, limited to breakdown of skin (HCC)       Type 2 diabetes, controlled, with neuropathy (HCC)       Pain in toes of both feet         -Examined patient -Discussed the progression of the wounds, continued care, and treatment alternatives. - Excisionally dedbrided ulceration at right lateral foot and left 3rd toe ucleration to healthy bleeding borders removing nonviable tissue using a sterile chisel blade. Wound measures post debridement as above. Wound was debrided to the level of the dermis with viable wound base exposed to promote healing. Hemostasis was achieved with manuel pressure. Patient tolerated procedure well without any discomfort or anesthesia necessary for this wound debridement.  -Applied medihoney and Band-Aid dressings to ulcerations covered with Band-Aid and advised patient to have nurse at home to do the same like before -Diabetic shoes were dispensed to patient and advised patient to break in shoes slowly and she can wear them full time once the wounds have healed - Advised patient to go to the ER or return to office if the wound worsens or if constitutional symptoms are present. -Patient to return to office 3 weeks for wound care  or sooner if problems arise.  Patient to bring her daughter with her to next visit to discuss possible procedures for left third toe possible tenotomy versus distal phalanx amputation versus hammertoe correction.  Landis Martins,  DPM

## 2018-06-15 ENCOUNTER — Encounter: Payer: Self-pay | Admitting: Sports Medicine

## 2018-06-15 ENCOUNTER — Ambulatory Visit (INDEPENDENT_AMBULATORY_CARE_PROVIDER_SITE_OTHER): Payer: Medicare Other | Admitting: Sports Medicine

## 2018-06-15 VITALS — BP 129/68 | HR 65 | Temp 97.3°F | Resp 16

## 2018-06-15 DIAGNOSIS — E1140 Type 2 diabetes mellitus with diabetic neuropathy, unspecified: Secondary | ICD-10-CM

## 2018-06-15 DIAGNOSIS — M79674 Pain in right toe(s): Secondary | ICD-10-CM

## 2018-06-15 DIAGNOSIS — L97521 Non-pressure chronic ulcer of other part of left foot limited to breakdown of skin: Secondary | ICD-10-CM | POA: Diagnosis not present

## 2018-06-15 DIAGNOSIS — L97511 Non-pressure chronic ulcer of other part of right foot limited to breakdown of skin: Secondary | ICD-10-CM

## 2018-06-15 DIAGNOSIS — M79675 Pain in left toe(s): Secondary | ICD-10-CM

## 2018-06-15 NOTE — Progress Notes (Signed)
Subjective: Shannon [name] is a 78 y.o. female patient seen in office for evaluation of ulceration of the left 3rd toe ulceration and right lateral foot ulceration.  Patient reports that the wound seems to be doing okay however there is increased soreness along the lateral side of the right foot with weightbearing.  Patient is still a resident at [facility] and has home nurse assisting with wound care applying meta honey. Denies nausea/fever/vomiting/chills/night sweats/shortness of breath/pain. Patient has no other pedal complaints at this time.  Patient Active Problem List   Diagnosis Date Noted  . Depression 10/15/2016   Current Outpatient Medications on File Prior to Visit  Medication Sig Dispense Refill  . amoxicillin (AMOXIL) 500 MG tablet Take 500 mg by mouth 2 (two) times daily.    Marland Kitchen aspirin EC 81 MG tablet Take 81 mg by mouth daily.    . butalbital-acetaminophen-caffeine (FIORICET WITH CODEINE) 50-325-40-30 MG capsule Take 1 capsule by mouth every 4 (four) hours as needed for headache.    . carbamide peroxide (DEBROX) 6.5 % OTIC solution 5 drops 2 (two) times daily.    . celecoxib (CELEBREX) 200 MG capsule     . cetirizine (ZYRTEC) 10 MG tablet Take 10 mg by mouth daily.    . citalopram (CELEXA) 20 MG tablet Take 20 mg by mouth daily.    . clotrimazole (LOTRIMIN) 1 % cream Apply 1 application topically 2 (two) times daily.    Marland Kitchen EPINEPHrine (ADRENALIN) 0.1 % nasal solution Place 1 drop into the nose once.    . furosemide (LASIX) 20 MG tablet Take 20 mg by mouth.    . gabapentin (NEURONTIN) 600 MG tablet Take 600 mg by mouth 3 (three) times daily.    Marland Kitchen HYDROcodone-acetaminophen (NORCO/VICODIN) 5-325 MG tablet Take 1 tablet by mouth every 6 (six) hours as needed for moderate pain.    . INVOKANA 300 MG TABS     . LANTUS SOLOSTAR 100 UNIT/ML Solostar Pen     . lidocaine (LIDODERM) 5 %     . LORazepam (ATIVAN) 1 MG tablet Take 1 mg by mouth every 8 (eight) hours.    Marland Kitchen  losartan (COZAAR) 50 MG tablet     . metFORMIN (GLUCOPHAGE) 500 MG tablet     . MYRBETRIQ 25 MG TB24 tablet 50 mg.     . nystatin (MYCOSTATIN/NYSTOP) 100000 UNIT/GM POWD     . omeprazole (PRILOSEC) 20 MG capsule     . ONGLYZA 5 MG TABS tablet     . Polyethyl Glycol-Propyl Glycol (SYSTANE) 0.4-0.3 % GEL ophthalmic gel Place 1 application into both eyes.    . Skin Protectants, Misc. (MINERIN) CREA Apply topically.    . traMADol (ULTRAM-ER) 200 MG 24 hr tablet     . triamcinolone cream (KENALOG) 0.1 % Apply 1 application topically 2 (two) times daily.    . Wound Dressings (MEDIHONEY WOUND/BURN DRESSING) GEL Apply to left toe ulceration 3x per week 15 mL 1   No current facility-administered medications on file prior to visit.    Allergies  Allergen Reactions  . Peanut Oil Anaphylaxis  . Demerol  [Meperidine Hcl] Nausea And Vomiting  . Other Hives    Takes skin off  . Demerol [Meperidine]   . Peanut Butter Flavor   . Tape Dermatitis    No results found for this or any previous visit (from the past 2160 hour(s)).  Objective: There were no vitals filed for this visit.  General: Patient is awake, alert, oriented  x 3 and in no acute distress.  Dermatology: Skin is warm and dry bilateral with a partial thickness ulceration present Left 3rd toe distal tuft. Ulceration measures 0.6cm x 0.6 cm x 0.1 cm, larger than previous. There is a keratotic border with a granular base. The ulceration does not probe to bone. There is no malodor, no active drainage, mild blanchable erythema, no edema.   There is a partial thickness ulceration present at the right lateral foot that measures 0.3 x 0.4 x0.1cm smaller than previous with a granular base and mildly keratotic border the ulceration does not probe to bone there is no malodor no active drainage mild edema.  No other acute signs of infection.  Nails x 10 are short and thickened consistent with onychomycosis.   Vascular: Dorsalis Pedis pulse = 1/4  Bilateral,  Posterior Tibial pulse = 1/4 Bilateral,  Capillary Fill Time < 5 seconds.  Trace edema bilateral.  Neurologic: Protective sensation intact/diminished/absent to the level of absent using  the 5.07/10g [employer].  Musculosketal: No Pain with palpation to ulcerated areas. No pain with compression to calves bilateral. + Bunion and hammertoe with crossover, midfoot arthritis/exostosis and pes planus gross bony deformities noted bilateral.  No results for input(s): GRAMSTAIN, LABORGA in the last 8760 hours.  Assessment and Plan:  Problem List Items Addressed This Visit    None    Visit Diagnoses    Toe ulcer, left, limited to breakdown of skin ([place])    -  Primary   Right foot ulcer, limited to breakdown of skin ([facility])       Type 2 diabetes, controlled, with neuropathy (HCC)       Relevant Medications   metFORMIN (GLUCOPHAGE) 500 MG tablet   aspirin EC 81 MG tablet   Pain in toes of both feet         -Examined patient -Discussed the progression of the wounds, continued care, and treatment alternatives. - Excisionally dedbrided ulceration at right lateral foot and left 3rd toe ucleration to healthy bleeding borders removing nonviable tissue using a sterile chisel blade. Wound measures post debridement as above. Wound was debrided to the level of the dermis with viable wound base exposed to promote healing. Hemostasis was achieved with manuel pressure. Patient tolerated procedure well without any discomfort or anesthesia necessary for this wound debridement.  -Applied medihoney and Band-Aid dressings to ulcerations covered with Band-Aid and advised patient to have nurse at home to do the same like before -Applied offloading padding to right shoe -Discussed treatment options of tenotomy with patient's daughter present and discussed risks alternatives and benefits.  No guarantees given or implied and advised patient that this may be a good option for [place] to try to  decrease the pressure that is being put on the ulcer at the third toe due to toe deformity and if this does not work then may have to move forward with a hammertoe procedure or partial toe amputation. - Advised patient to go to the ER or return to office if the wound worsens or if constitutional symptoms are present. -Patient to return to office in 2 weeks for in office tenotomy on left third toe.  Landis Martins, DPM

## 2018-06-24 ENCOUNTER — Ambulatory Visit (INDEPENDENT_AMBULATORY_CARE_PROVIDER_SITE_OTHER): Payer: Medicare Other | Admitting: Sports Medicine

## 2018-06-24 ENCOUNTER — Encounter: Payer: Self-pay | Admitting: Sports Medicine

## 2018-06-24 VITALS — BP 123/58 | HR 70 | Temp 97.1°F | Resp 16

## 2018-06-24 DIAGNOSIS — M2042 Other hammer toe(s) (acquired), left foot: Secondary | ICD-10-CM

## 2018-06-24 DIAGNOSIS — L97521 Non-pressure chronic ulcer of other part of left foot limited to breakdown of skin: Secondary | ICD-10-CM

## 2018-06-24 DIAGNOSIS — M6240 Contracture of muscle, unspecified site: Secondary | ICD-10-CM

## 2018-06-24 DIAGNOSIS — L97511 Non-pressure chronic ulcer of other part of right foot limited to breakdown of skin: Secondary | ICD-10-CM

## 2018-06-24 DIAGNOSIS — E1140 Type 2 diabetes mellitus with diabetic neuropathy, unspecified: Secondary | ICD-10-CM

## 2018-06-24 MED ORDER — AMOXICILLIN-POT CLAVULANATE 875-125 MG PO TABS: 1.0000 | ORAL_TABLET | Freq: Two times a day (BID) | ORAL | 0 refills | Status: DC

## 2018-06-24 NOTE — Progress Notes (Signed)
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 Chronic toe ulcer left  POSTOPERATIVE DIAGNOSES: 1. Same  ANESTHESIA:3cc's1% lidocaine plain and0.5% marcaine plain in a digital block fashion.  HEMOSTASIS:Manuel pressure  ESTIMATED BLOOD LOSS: Less than 2 mL.  MATERIALS USED:18 g needle and chisel blade  PROCEDURE PERFORMED: Flexor tenotomy left 3rd toe  INDICATIONS FOR PROCEDURE: Pain to left 3rd toe distal tuft with chronic toe ulcer not resolved with conservative care of the wound care debridement offloading antibiotics  DESCRIPTION OF THE PROCEDURE: The patient was brought to the in officetreatmentroom and placed in the chair in the supine position. Procedure was thoroughly explained to patient and the consent was obtained. The left foot was then prepped using Betadine and a preoperative injection consisting of 3 cc of 1:1 mixture 1% lidocaine plain and 0.5% Marcaine plain was administered in a local field block fashion following anesthesia was then confirmed utilizing a 1 to pick up  attention was then directed to the plantar aspect of the left 2nd toe where there was digital contracture noted of the flexor tendon with subsequent hammertoe deformity with granular ulceration measuring less than 0.5 cm at the distal tuft of the left third toe chronic in nature thus utilizing a 18-gauge needle at the plantar aspect of the interphalangeal joint a small stab incision was made with the needle and the tendon was slowly released and a small chisel fashion using the sharp point of the 18-gauge needle while applying dorsal pressure to the great toe to fully release the tendon of the flexor hallucis longus. Once sufficient release was noted then the area was then thoroughly cleansed and Betadine was applied to the surgical wound and dressed with Steri-Strips,copious amounts of fluff and Kling, stockinette, and Cobanbandage.  Patient was advised to wear loose fitting tennis shoe or return to postoperative shoe until next visit.   Patient tolerated the procedure well. There were no complications.  The patient was then held in postanesthesia waiting area with vital signs stable and the vascular status at appropriate levels. Rx Augmentin for preventative measures. The patient was eventually discharged from office and advised to follow up with Dr. Cannon Kettle in one week's time for herfirst postoperative appointment.   Also at this visit evaluated right foot ulceration that measures less than 0.5 cm at the lateral foot ulceration healthy and granular does not probe to bone with mild soft tissue swelling and subjective tenderness when weightbearing that seems to stay sore per patient.  Applied meta honey and offloading padding dressing and advised patient to have her home nurse to do the same.  Patient will also have her home nurse to change her post procedure dressing on the left applying Steri-Strips and Betadine as instructed.  Patient was advised also to monitor for any signs of infection and to call office if there is any constitutional symptoms.  Patient to follow-up as scheduled on next week for her first postoperative appointment.  Landis Martins, DPM

## 2018-07-01 ENCOUNTER — Encounter: Payer: Medicare Other | Admitting: Sports Medicine

## 2019-03-01 ENCOUNTER — Ambulatory Visit (INDEPENDENT_AMBULATORY_CARE_PROVIDER_SITE_OTHER): Payer: Medicare Other

## 2019-03-01 ENCOUNTER — Ambulatory Visit (INDEPENDENT_AMBULATORY_CARE_PROVIDER_SITE_OTHER): Payer: Medicare Other | Admitting: Sports Medicine

## 2019-03-01 ENCOUNTER — Encounter: Payer: Self-pay | Admitting: Sports Medicine

## 2019-03-01 ENCOUNTER — Other Ambulatory Visit: Payer: Self-pay

## 2019-03-01 ENCOUNTER — Other Ambulatory Visit: Payer: Self-pay | Admitting: Sports Medicine

## 2019-03-01 DIAGNOSIS — L97521 Non-pressure chronic ulcer of other part of left foot limited to breakdown of skin: Secondary | ICD-10-CM

## 2019-03-01 DIAGNOSIS — L03032 Cellulitis of left toe: Secondary | ICD-10-CM | POA: Diagnosis not present

## 2019-03-01 DIAGNOSIS — E1140 Type 2 diabetes mellitus with diabetic neuropathy, unspecified: Secondary | ICD-10-CM

## 2019-03-01 DIAGNOSIS — L97511 Non-pressure chronic ulcer of other part of right foot limited to breakdown of skin: Secondary | ICD-10-CM

## 2019-03-01 DIAGNOSIS — L02612 Cutaneous abscess of left foot: Secondary | ICD-10-CM | POA: Diagnosis not present

## 2019-03-01 MED ORDER — AMOXICILLIN-POT CLAVULANATE 875-125 MG PO TABS: 1.0000 | ORAL_TABLET | Freq: Two times a day (BID) | ORAL | 0 refills | Status: DC

## 2019-03-01 NOTE — Progress Notes (Signed)
Subjective: Shannon [name] is a 78 y.o. female patient seen in office for evaluation of ulceration of the left 1st toe. Patient has a history of diabetes and a blood glucose level today of 100 mg/dl.  A1c 5.4. Patient is changing the dressing using medihoney at home. Denies nausea/fever/vomiting/night sweats/shortness of breath/pain. ,But does admits chills. Patient has no other pedal complaints at this time.  Patient Active Problem List   Diagnosis Date Noted  . Depression 10/15/2016   Current Outpatient Medications on File Prior to Visit  Medication Sig Dispense Refill  . amoxicillin (AMOXIL) 500 MG tablet Take 500 mg by mouth 2 (two) times daily.    Marland Kitchen aspirin EC 81 MG tablet Take 81 mg by mouth daily.    . butalbital-acetaminophen-caffeine (FIORICET WITH CODEINE) 50-325-40-30 MG capsule Take 1 capsule by mouth every 4 (four) hours as needed for headache.    . carbamide peroxide (DEBROX) 6.5 % OTIC solution 5 drops 2 (two) times daily.    . celecoxib (CELEBREX) 200 MG capsule     . cetirizine (ZYRTEC) 10 MG tablet Take 10 mg by mouth daily.    . citalopram (CELEXA) 20 MG tablet Take 20 mg by mouth daily.    . clotrimazole (LOTRIMIN) 1 % cream Apply 1 application topically 2 (two) times daily.    Marland Kitchen EPINEPHrine (ADRENALIN) 0.1 % nasal solution Place 1 drop into the nose once.    . furosemide (LASIX) 20 MG tablet Take 20 mg by mouth.    . gabapentin (NEURONTIN) 600 MG tablet Take 600 mg by mouth 3 (three) times daily.    Marland Kitchen HYDROcodone-acetaminophen (NORCO/VICODIN) 5-325 MG tablet Take 1 tablet by mouth every 6 (six) hours as needed for moderate pain.    . INVOKANA 300 MG TABS     . LANTUS SOLOSTAR 100 UNIT/ML Solostar Pen     . lidocaine (LIDODERM) 5 %     . LORazepam (ATIVAN) 1 MG tablet Take 1 mg by mouth every 8 (eight) hours.    Marland Kitchen losartan (COZAAR) 50 MG tablet     . metFORMIN (GLUCOPHAGE) 500 MG tablet     . MYRBETRIQ 25 MG TB24 tablet 50 mg.     . nystatin (MYCOSTATIN/NYSTOP) 100000  UNIT/GM POWD     . omeprazole (PRILOSEC) 20 MG capsule     . ONGLYZA 5 MG TABS tablet     . Polyethyl Glycol-Propyl Glycol (SYSTANE) 0.4-0.3 % GEL ophthalmic gel Place 1 application into both eyes.    . Skin Protectants, Misc. (MINERIN) CREA Apply topically.    . traMADol (ULTRAM-ER) 200 MG 24 hr tablet     . triamcinolone cream (KENALOG) 0.1 % Apply 1 application topically 2 (two) times daily.    . Wound Dressings (MEDIHONEY WOUND/BURN DRESSING) GEL Apply to left toe ulceration 3x per week 15 mL 1   No current facility-administered medications on file prior to visit.    Allergies  Allergen Reactions  . Peanut Oil Anaphylaxis  . Demerol  [Meperidine Hcl] Nausea And Vomiting  . Other Hives    Takes skin off  . Demerol [Meperidine]   . Peanut Butter Flavor   . Tape Dermatitis    No results found for this or any previous visit (from the past 2160 hour(s)).  Objective: There were no vitals filed for this visit.  General: Patient is awake, alert, oriented x 3 and in no acute distress.  Dermatology: Skin is warm and dry bilateral with a partial thickness ulceration present left  1st toe. Ulceration measures 1 cm x  0.5cm x 0.1 cm. There is a blistered border with a granular base. The ulceration does not probe to bone. There is no malodor, no active drainage, focal erythema and edema left midfoot. No other acute signs of infection.   Vascular: Dorsalis Pedis pulse = 1/4 Bilateral,  Posterior Tibial pulse = 1/4 Bilateral,  Capillary Fill Time < 5 seconds  Neurologic: Protective sensation intact diminished to the level of midfoot using the 5.07/10g [employer].  Musculosketal: There is mild pain with palpation to ulcerated area. No pain with compression to calves bilateral. No gross bony deformities noted bilateral.  Xrays, Left foot: Severe digital deformity. No bony destruction suggestive of osteomyelitis. No gas in soft tissues.   No results for input(s): GRAMSTAIN,  LABORGA in the last 8760 hours.  Assessment and Plan:  Problem List Items Addressed This Visit    None    Visit Diagnoses    Toe ulcer, left, limited to breakdown of skin ([place])    -  Primary   Relevant Orders   WOUND CULTURE   Type 2 diabetes, controlled, with neuropathy ([place])       Cellulitis and abscess of toe of left foot           -Examined patient and discussed the progression of the wound and treatment alternatives. -Xrays reviewed - Excisionally dedbrided ulceration at Left 1st toe to healthy bleeding borders removing nonviable tissue using a sterile chisel blade. Wound measures post debridement as above. Wound was debrided to the level of the dermis with viable wound base exposed to promote healing. Hemostasis was achieved with manuel pressure. Patient tolerated procedure well without any discomfort or anesthesia necessary for this wound debridement.  -Wound culture obtained -Applied betadine and dry sterile dressing and instructed patient to continue with daily dressings at home consisting of the same with help from home nursing -Rx Augmentin to take for cellulitis  - Advised patient to go to the ER or return to office if the wound worsens or if constitutional symptoms are present. -Patient to return to office in 1-2 week for follow up care and evaluation or sooner if problems arise.  Landis Martins, DPM

## 2019-03-03 ENCOUNTER — Other Ambulatory Visit: Payer: Self-pay | Admitting: Sports Medicine

## 2019-03-03 LAB — WOUND CULTURE

## 2019-03-03 MED ORDER — SULFAMETHOXAZOLE-TRIMETHOPRIM 400-80 MG PO TABS: 1.0000 | ORAL_TABLET | Freq: Two times a day (BID) | ORAL | 0 refills | Status: DC

## 2019-03-06 ENCOUNTER — Telehealth: Payer: Self-pay

## 2019-03-06 NOTE — Telephone Encounter (Signed)
Called Pt to inform her of her new Dr's order and Rx. Pt did not answered phone called and there was not message set up

## 2019-03-06 NOTE — Telephone Encounter (Signed)
-----   Message from Landis Martins, [place] sent at 03/03/2019  4:51 PM EST ----- Tell patient to Stop Augmentin and start Bactrim. I have already sent Rx to her pharmacy  -Dr. Cannon Kettle

## 2019-03-22 ENCOUNTER — Ambulatory Visit: Payer: Medicare Other | Admitting: Sports Medicine

## 2019-03-23 ENCOUNTER — Ambulatory Visit: Payer: Medicare Other | Admitting: Sports Medicine

## 2019-03-24 ENCOUNTER — Other Ambulatory Visit: Payer: Self-pay

## 2019-03-24 ENCOUNTER — Ambulatory Visit (INDEPENDENT_AMBULATORY_CARE_PROVIDER_SITE_OTHER): Payer: Medicare Other | Admitting: Sports Medicine

## 2019-03-24 ENCOUNTER — Encounter: Payer: Self-pay | Admitting: Sports Medicine

## 2019-03-24 DIAGNOSIS — L03032 Cellulitis of left toe: Secondary | ICD-10-CM | POA: Diagnosis not present

## 2019-03-24 DIAGNOSIS — E1140 Type 2 diabetes mellitus with diabetic neuropathy, unspecified: Secondary | ICD-10-CM

## 2019-03-24 DIAGNOSIS — L02612 Cutaneous abscess of left foot: Secondary | ICD-10-CM

## 2019-03-24 DIAGNOSIS — L97521 Non-pressure chronic ulcer of other part of left foot limited to breakdown of skin: Secondary | ICD-10-CM

## 2019-03-24 MED ORDER — SULFAMETHOXAZOLE-TRIMETHOPRIM 400-80 MG PO TABS: 1.0000 | ORAL_TABLET | Freq: Two times a day (BID) | ORAL | 0 refills | Status: DC

## 2019-03-24 NOTE — Progress Notes (Signed)
Subjective: Shannon [name] is a 78 y.o. female patient seen in office for evaluation of ulceration of the left 1st toe. Patient has a history of diabetes and a blood glucose level today of 93 mg/dl.  A1c 5.4. Patient is changing the dressing using betadine at home with nursing that just started this week. Reports nurse said that there is now two sores at tip of toe and reports that she had a uric acid test done that was 4.2. Denies chills/ nausea/fever/vomiting/night sweats/shortness of breath/pain.Patient has no other pedal complaints at this time.  Patient Active Problem List   Diagnosis Date Noted  . Depression 10/15/2016   Current Outpatient Medications on File Prior to Visit  Medication Sig Dispense Refill  . amoxicillin (AMOXIL) 500 MG tablet Take 500 mg by mouth 2 (two) times daily.    Marland Kitchen aspirin EC 81 MG tablet Take 81 mg by mouth daily.    . butalbital-acetaminophen-caffeine (FIORICET WITH CODEINE) 50-325-40-30 MG capsule Take 1 capsule by mouth every 4 (four) hours as needed for headache.    . carbamide peroxide (DEBROX) 6.5 % OTIC solution 5 drops 2 (two) times daily.    . celecoxib (CELEBREX) 200 MG capsule     . cetirizine (ZYRTEC) 10 MG tablet Take 10 mg by mouth daily.    . citalopram (CELEXA) 20 MG tablet Take 20 mg by mouth daily.    . clotrimazole (LOTRIMIN) 1 % cream Apply 1 application topically 2 (two) times daily.    Marland Kitchen donepezil (ARICEPT) 5 MG tablet Take 5 mg by mouth daily.    Marland Kitchen EPINEPHrine (ADRENALIN) 0.1 % nasal solution Place 1 drop into the nose once.    . furosemide (LASIX) 20 MG tablet Take 20 mg by mouth.    . gabapentin (NEURONTIN) 600 MG tablet Take 600 mg by mouth 3 (three) times daily.    Marland Kitchen HYDROcodone-acetaminophen (NORCO/VICODIN) 5-325 MG tablet Take 1 tablet by mouth every 6 (six) hours as needed for moderate pain.    . INVOKANA 300 MG TABS     . LANTUS SOLOSTAR 100 UNIT/ML Solostar Pen     . lidocaine (LIDODERM) 5 %     . LORazepam (ATIVAN) 1 MG tablet  Take 1 mg by mouth every 8 (eight) hours.    Marland Kitchen losartan (COZAAR) 50 MG tablet     . metFORMIN (GLUCOPHAGE) 500 MG tablet     . MYRBETRIQ 25 MG TB24 tablet 50 mg.     . nystatin (MYCOSTATIN/NYSTOP) 100000 UNIT/GM POWD     . omeprazole (PRILOSEC) 20 MG capsule     . ONGLYZA 5 MG TABS tablet     . Polyethyl Glycol-Propyl Glycol (SYSTANE) 0.4-0.3 % GEL ophthalmic gel Place 1 application into both eyes.    . potassium chloride SA (KLOR-CON) 20 MEQ tablet Take 20 mEq by mouth daily.    . Skin Protectants, Misc. (MINERIN) CREA Apply topically.    . traMADol (ULTRAM-ER) 200 MG 24 hr tablet     . traZODone (DESYREL) 100 MG tablet Take 100 mg by mouth at bedtime.    . triamcinolone cream (KENALOG) 0.1 % Apply 1 application topically 2 (two) times daily.    . Wound Dressings (MEDIHONEY WOUND/BURN DRESSING) GEL Apply to left toe ulceration 3x per week 15 mL 1   No current facility-administered medications on file prior to visit.    Allergies  Allergen Reactions  . Peanut Oil Anaphylaxis  . Demerol  [Meperidine Hcl] Nausea And Vomiting  . Other Hives  Takes skin off  . Demerol [Meperidine]   . Peanut Butter Flavor   . Tape Dermatitis    Recent Results (from the past 2160 hour(s))  WOUND CULTURE     Status: Abnormal   Collection Time: 03/01/19  3:30 PM   Specimen: Foot, Left; Wound   WOUND CULTURE AND SENS  Result Value Ref Range   Gram Stain Result Final report    Organism ID, Bacteria Comment     Comment: No white blood cells seen.   Organism ID, Bacteria Comment     Comment: Few gram positive cocci   Aerobic Bacterial Culture Final report (A)    Organism ID, Bacteria Staphylococcus aureus (A)     Comment: Heavy growth Methicillin resistant (MRSA) Based on resistance to oxacillin this isolate would be resistant to all currently available beta-lactam antimicrobial agents, with the exception of the newer cephalosporins with anti-MRSA activity, such as Ceftaroline    Antimicrobial  Susceptibility Comment     Comment:       ** S = Susceptible; I = Intermediate; R = Resistant **                    P = Positive; N = Negative             MICS are expressed in micrograms per mL    Antibiotic                 RSLT#1    RSLT#2    RSLT#3    RSLT#4 Ciprofloxacin                  R Clindamycin                    S Erythromycin                   R Gentamicin                     S Levofloxacin                   I Linezolid                      S Oxacillin                      R Penicillin                     R Rifampin                       S Tetracycline                   S Trimethoprim/Sulfa             S Vancomycin                     S     Objective: There were no vitals filed for this visit.  General: Patient is awake, alert, oriented x 3 and in no acute distress.  Dermatology: Skin is warm and dry bilateral with a partial thickness ulceration present left 1st toe. Ulceration measures distal tuft 0.5cm x0.8cm x0.1cm and  Plantar tuft measures 0.5cm x  0.5cm x 0.1 cm. There is a blistered border with a granular base. The ulceration does not probe to bone. There is no malodor, no active drainage,  focal decreased erythema and edema left midfoot. No other acute signs of infection.   Vascular: Dorsalis Pedis pulse = 1/4 Bilateral,  Posterior Tibial pulse = 1/4 Bilateral,  Capillary Fill Time < 5 seconds  Neurologic: Protective sensation intact diminished to the level of midfoot using the 5.07/10g [employer].  Musculosketal: There is mild pain with palpation to ulcerated area. No pain with compression to calves bilateral. + Hammertoe/digital bony deformities noted bilateral.   No results for input(s): GRAMSTAIN, LABORGA in the last 8760 hours.  Assessment and Plan:  Problem List Items Addressed This Visit    None    Visit Diagnoses    Toe ulcer, left, limited to breakdown of skin ([place])    -  Primary   Type 2 diabetes, controlled, with neuropathy  ([place])       Cellulitis and abscess of toe of left foot         -Examined patient and discussed the progression of the wounds and treatment alternatives. - Excisionally dedbrided ulceration at Left 1st toe to healthy bleeding borders removing nonviable tissue using a sterile chisel blade. Wound measures post debridement as above. -Applied iodosorb and dry sterile dressing and instructed patient to continue with daily dressings at home consisting of the same with help from home nursing -Advised patient to pick up Bactrim from pharmacy to take for + staph culture - Advised patient to go to the ER or return to office if the wound worsens or if constitutional symptoms are present. -Patient to return to office in 2-3 weeks for follow up care and evaluation or sooner if problems arise.  Landis Martins, DPM

## 2019-03-28 ENCOUNTER — Telehealth: Payer: Self-pay

## 2019-03-28 NOTE — Telephone Encounter (Signed)
[name] with [facility] called to confirm wound orders for Shannon [name]. I confirmed with the nurse to apply iodosorb and dry dressing to the Pt. Nurse stated understanding.

## 2019-03-30 ENCOUNTER — Ambulatory Visit: Payer: Medicare Other | Admitting: Sports Medicine

## 2019-04-07 ENCOUNTER — Encounter: Payer: Self-pay | Admitting: Sports Medicine

## 2019-04-07 ENCOUNTER — Ambulatory Visit (INDEPENDENT_AMBULATORY_CARE_PROVIDER_SITE_OTHER): Payer: Medicare Other | Admitting: Sports Medicine

## 2019-04-07 ENCOUNTER — Other Ambulatory Visit: Payer: Self-pay

## 2019-04-07 DIAGNOSIS — L97521 Non-pressure chronic ulcer of other part of left foot limited to breakdown of skin: Secondary | ICD-10-CM

## 2019-04-07 DIAGNOSIS — E1140 Type 2 diabetes mellitus with diabetic neuropathy, unspecified: Secondary | ICD-10-CM

## 2019-04-07 DIAGNOSIS — L03032 Cellulitis of left toe: Secondary | ICD-10-CM

## 2019-04-07 DIAGNOSIS — L02612 Cutaneous abscess of left foot: Secondary | ICD-10-CM

## 2019-04-07 NOTE — Progress Notes (Signed)
Subjective: Shannon [name] is a 78 y.o. female patient seen in office for follow up evaluation of ulceration of the left 1st toe. Patient has a history of diabetes and a blood glucose level today of 97mg /dl.  A1c 5.4. Patient is changing the dressing using Iodosorb but reports that it was healed but then nurse put a bandaid on it and it came off and re-opened the sore. Denies chills/ nausea/fever/vomiting/night sweats/shortness of breath/pain.Patient has no other pedal complaints at this time.  Patient Active Problem List   Diagnosis Date Noted  . Depression 10/15/2016   Current Outpatient Medications on File Prior to Visit  Medication Sig Dispense Refill  . amoxicillin (AMOXIL) 500 MG tablet Take 500 mg by mouth 2 (two) times daily.    Marland Kitchen aspirin EC 81 MG tablet Take 81 mg by mouth daily.    . butalbital-acetaminophen-caffeine (FIORICET WITH CODEINE) 50-325-40-30 MG capsule Take 1 capsule by mouth every 4 (four) hours as needed for headache.    . carbamide peroxide (DEBROX) 6.5 % OTIC solution 5 drops 2 (two) times daily.    . celecoxib (CELEBREX) 200 MG capsule     . cetirizine (ZYRTEC) 10 MG tablet Take 10 mg by mouth daily.    . citalopram (CELEXA) 20 MG tablet Take 20 mg by mouth daily.    . clotrimazole (LOTRIMIN) 1 % cream Apply 1 application topically 2 (two) times daily.    Marland Kitchen donepezil (ARICEPT) 5 MG tablet Take 5 mg by mouth daily.    Marland Kitchen EPINEPHrine (ADRENALIN) 0.1 % nasal solution Place 1 drop into the nose once.    . furosemide (LASIX) 20 MG tablet Take 20 mg by mouth.    . gabapentin (NEURONTIN) 600 MG tablet Take 600 mg by mouth 3 (three) times daily.    Marland Kitchen HYDROcodone-acetaminophen (NORCO/VICODIN) 5-325 MG tablet Take 1 tablet by mouth every 6 (six) hours as needed for moderate pain.    . INVOKANA 300 MG TABS     . LANTUS SOLOSTAR 100 UNIT/ML Solostar Pen     . lidocaine (LIDODERM) 5 %     . LORazepam (ATIVAN) 1 MG tablet Take 1 mg by mouth every 8 (eight) hours.    Marland Kitchen losartan  (COZAAR) 50 MG tablet     . metFORMIN (GLUCOPHAGE) 500 MG tablet     . MYRBETRIQ 25 MG TB24 tablet 50 mg.     . nystatin (MYCOSTATIN/NYSTOP) 100000 UNIT/GM POWD     . omeprazole (PRILOSEC) 20 MG capsule     . ONGLYZA 5 MG TABS tablet     . Polyethyl Glycol-Propyl Glycol (SYSTANE) 0.4-0.3 % GEL ophthalmic gel Place 1 application into both eyes.    . potassium chloride SA (KLOR-CON) 20 MEQ tablet Take 20 mEq by mouth daily.    . Skin Protectants, Misc. (MINERIN) CREA Apply topically.    . sulfamethoxazole-trimethoprim (BACTRIM) 400-80 MG tablet Take 1 tablet by mouth 2 (two) times daily. 28 tablet 0  . traMADol (ULTRAM-ER) 200 MG 24 hr tablet     . traZODone (DESYREL) 100 MG tablet Take 100 mg by mouth at bedtime.    . triamcinolone cream (KENALOG) 0.1 % Apply 1 application topically 2 (two) times daily.    . Wound Dressings (MEDIHONEY WOUND/BURN DRESSING) GEL Apply to left toe ulceration 3x per week 15 mL 1   No current facility-administered medications on file prior to visit.   Allergies  Allergen Reactions  . Peanut Oil Anaphylaxis  . Demerol  [Meperidine Hcl] Nausea  And Vomiting  . Other Hives    Takes skin off  . Demerol [Meperidine]   . Peanut Butter Flavor   . Tape Dermatitis    Recent Results (from the past 2160 hour(s))  WOUND CULTURE     Status: Abnormal   Collection Time: 03/01/19  3:30 PM   Specimen: Foot, Left; Wound   WOUND CULTURE AND SENS  Result Value Ref Range   Gram Stain Result Final report    Organism ID, Bacteria Comment     Comment: No white blood cells seen.   Organism ID, Bacteria Comment     Comment: Few gram positive cocci   Aerobic Bacterial Culture Final report (A)    Organism ID, Bacteria Staphylococcus aureus (A)     Comment: Heavy growth Methicillin resistant (MRSA) Based on resistance to oxacillin this isolate would be resistant to all currently available beta-lactam antimicrobial agents, with the exception of the newer cephalosporins with  anti-MRSA activity, such as Ceftaroline    Antimicrobial Susceptibility Comment     Comment:       ** S = Susceptible; I = Intermediate; R = Resistant **                    P = Positive; N = Negative             MICS are expressed in micrograms per mL    Antibiotic                 RSLT#1    RSLT#2    RSLT#3    RSLT#4 Ciprofloxacin                  R Clindamycin                    S Erythromycin                   R Gentamicin                     S Levofloxacin                   I Linezolid                      S Oxacillin                      R Penicillin                     R Rifampin                       S Tetracycline                   S Trimethoprim/Sulfa             S Vancomycin                     S     Objective: There were no vitals filed for this visit.  General: Patient is awake, alert, oriented x 3 and in no acute distress.  Dermatology: Skin is warm and dry bilateral with a partial thickness ulceration present left 1st toe. Ulceration measures distal tuft 0.4cm x0.5cm x0.1cm and proximal plantar tuft measures 0.1cm x  0.1cm x 0.1 cm with a granular base. The ulcerations does not probe to bone. There is no  malodor, no active drainage, focal decreased erythema that's blanchable and decreased edema. No other acute signs of infection.   Vascular: Dorsalis Pedis pulse = 1/4 Bilateral,  Posterior Tibial pulse = 1/4 Bilateral,  Capillary Fill Time < 5 seconds  Neurologic: Protective sensation intact diminished to the level of midfoot using the 5.07/10g [employer].  Musculosketal: There is mild pain with palpation to ulcerated area to left 1st toe. No pain with compression to calves bilateral. + Hammertoe/digital bony deformities noted bilateral.   No results for input(s): GRAMSTAIN, LABORGA in the last 8760 hours.  Assessment and Plan:  Problem List Items Addressed This Visit    None    Visit Diagnoses    Toe ulcer, left, limited to breakdown of skin  ([place])    -  Primary   Type 2 diabetes, controlled, with neuropathy ([place])       Cellulitis and abscess of toe of left foot         -Examined patient and discussed the progression of the wounds and treatment alternatives. - Excisionally dedbrided ulceration at Left 1st toe to healthy bleeding borders removing nonviable tissue using a sterile chisel blade. Wound measures post debridement as above. -Applied iodosorb and dry sterile dressing and instructed patient to continue with daily dressings at home consisting of the same with help from home nursing 3x per week -Continue with Bactrim until finished - Advised patient to go to the ER or return to office if the wound worsens or if constitutional symptoms are present. -Patient to return to office in 2-3 weeks for follow up care and evaluation or sooner if problems arise.  Landis Martins, DPM

## 2019-04-28 ENCOUNTER — Ambulatory Visit: Payer: Medicare Other | Admitting: Sports Medicine

## 2019-05-04 ENCOUNTER — Other Ambulatory Visit: Payer: Self-pay

## 2019-05-04 ENCOUNTER — Ambulatory Visit (INDEPENDENT_AMBULATORY_CARE_PROVIDER_SITE_OTHER): Payer: Medicare PPO | Admitting: Sports Medicine

## 2019-05-04 ENCOUNTER — Encounter: Payer: Self-pay | Admitting: Sports Medicine

## 2019-05-04 DIAGNOSIS — E1140 Type 2 diabetes mellitus with diabetic neuropathy, unspecified: Secondary | ICD-10-CM

## 2019-05-04 DIAGNOSIS — L97521 Non-pressure chronic ulcer of other part of left foot limited to breakdown of skin: Secondary | ICD-10-CM

## 2019-05-04 DIAGNOSIS — M2042 Other hammer toe(s) (acquired), left foot: Secondary | ICD-10-CM

## 2019-05-04 NOTE — Progress Notes (Signed)
Subjective: Shannon [name] is a 79 y.o. female patient seen in office for follow up evaluation of ulceration of the left 1st toe. Patient has a history of diabetes and a blood glucose level today of 99mg /dl.  A1c 5.4. Patient is changing the dressing using Iodosorb with help from home nurse who reports that it is getting better and healing, patient denies chills/ nausea/fever/vomiting/night sweats/shortness of breath/pain. Reports that she wants new Diabetic shoes since she has a $700 credit with our office.Patient has no other pedal complaints at this time.  Patient Active Problem List   Diagnosis Date Noted  . Depression 10/15/2016   Current Outpatient Medications on File Prior to Visit  Medication Sig Dispense Refill  . amoxicillin (AMOXIL) 500 MG tablet Take 500 mg by mouth 2 (two) times daily.    Marland Kitchen aspirin EC 81 MG tablet Take 81 mg by mouth daily.    . butalbital-acetaminophen-caffeine (FIORICET WITH CODEINE) 50-325-40-30 MG capsule Take 1 capsule by mouth every 4 (four) hours as needed for headache.    . carbamide peroxide (DEBROX) 6.5 % OTIC solution 5 drops 2 (two) times daily.    . celecoxib (CELEBREX) 200 MG capsule     . cetirizine (ZYRTEC) 10 MG tablet Take 10 mg by mouth daily.    . citalopram (CELEXA) 20 MG tablet Take 20 mg by mouth daily.    . clotrimazole (LOTRIMIN) 1 % cream Apply 1 application topically 2 (two) times daily.    Marland Kitchen donepezil (ARICEPT) 5 MG tablet Take 5 mg by mouth daily.    Marland Kitchen EPINEPHrine (ADRENALIN) 0.1 % nasal solution Place 1 drop into the nose once.    . furosemide (LASIX) 20 MG tablet Take 20 mg by mouth.    . gabapentin (NEURONTIN) 600 MG tablet Take 600 mg by mouth 3 (three) times daily.    Marland Kitchen HYDROcodone-acetaminophen (NORCO/VICODIN) 5-325 MG tablet Take 1 tablet by mouth every 6 (six) hours as needed for moderate pain.    . INVOKANA 300 MG TABS     . LANTUS SOLOSTAR 100 UNIT/ML Solostar Pen     . lidocaine (LIDODERM) 5 %     . LORazepam (ATIVAN) 1 MG  tablet Take 1 mg by mouth every 8 (eight) hours.    Marland Kitchen losartan (COZAAR) 50 MG tablet     . metFORMIN (GLUCOPHAGE) 500 MG tablet     . MYRBETRIQ 25 MG TB24 tablet 50 mg.     . nystatin (MYCOSTATIN/NYSTOP) 100000 UNIT/GM POWD     . omeprazole (PRILOSEC) 20 MG capsule     . ONGLYZA 5 MG TABS tablet     . Polyethyl Glycol-Propyl Glycol (SYSTANE) 0.4-0.3 % GEL ophthalmic gel Place 1 application into both eyes.    . potassium chloride SA (KLOR-CON) 20 MEQ tablet Take 20 mEq by mouth daily.    . Skin Protectants, Misc. (MINERIN) CREA Apply topically.    . sulfamethoxazole-trimethoprim (BACTRIM) 400-80 MG tablet Take 1 tablet by mouth 2 (two) times daily. 28 tablet 0  . traMADol (ULTRAM-ER) 200 MG 24 hr tablet     . traZODone (DESYREL) 100 MG tablet Take 100 mg by mouth at bedtime.    . triamcinolone cream (KENALOG) 0.1 % Apply 1 application topically 2 (two) times daily.    . Wound Dressings (MEDIHONEY WOUND/BURN DRESSING) GEL Apply to left toe ulceration 3x per week 15 mL 1   No current facility-administered medications on file prior to visit.   Allergies  Allergen Reactions  . Peanut  Oil Anaphylaxis  . Demerol  [Meperidine Hcl] Nausea And Vomiting  . Other Hives    Takes skin off  . Demerol [Meperidine]   . Peanut Butter Flavor   . Tape Dermatitis    Recent Results (from the past 2160 hour(s))  WOUND CULTURE     Status: Abnormal   Collection Time: 03/01/19  3:30 PM   Specimen: Foot, Left; Wound   WOUND CULTURE AND SENS  Result Value Ref Range   Gram Stain Result Final report    Organism ID, Bacteria Comment     Comment: No white blood cells seen.   Organism ID, Bacteria Comment     Comment: Few gram positive cocci   Aerobic Bacterial Culture Final report (A)    Organism ID, Bacteria Staphylococcus aureus (A)     Comment: Heavy growth Methicillin resistant (MRSA) Based on resistance to oxacillin this isolate would be resistant to all currently available beta-lactam  antimicrobial agents, with the exception of the newer cephalosporins with anti-MRSA activity, such as Ceftaroline    Antimicrobial Susceptibility Comment     Comment:       ** S = Susceptible; I = Intermediate; R = Resistant **                    P = Positive; N = Negative             MICS are expressed in micrograms per mL    Antibiotic                 RSLT#1    RSLT#2    RSLT#3    RSLT#4 Ciprofloxacin                  R Clindamycin                    S Erythromycin                   R Gentamicin                     S Levofloxacin                   I Linezolid                      S Oxacillin                      R Penicillin                     R Rifampin                       S Tetracycline                   S Trimethoprim/Sulfa             S Vancomycin                     S     Objective: There were no vitals filed for this visit.  General: Patient is awake, alert, oriented x 3 and in no acute distress.  Dermatology: Skin is warm and dry bilateral with a partial thickness ulceration present left 1st toe. Ulceration measures distal tuft 0.3cm x0.3cm x0.1cm (smaller than previous) with a granular base. The ulcerations does not probe to bone. There is no  malodor, no active drainage, focal decreased erythema that's blanchable and decreased edema. No other acute signs of infection.   Vascular: Dorsalis Pedis pulse = 1/4 Bilateral,  Posterior Tibial pulse = 1/4 Bilateral,  Capillary Fill Time < 5 seconds  Neurologic: Protective sensation intact diminished to the level of midfoot using the 5.07/10g [employer].  Musculosketal: There is mild pain with palpation to ulcerated area to left 1st toe. No pain with compression to calves bilateral. + Hammertoe/digital bony deformities noted bilateral.  Pes planus foot type bilateral.  No results for input(s): GRAMSTAIN, LABORGA in the last 8760 hours.  Assessment and Plan:  Problem List Items Addressed This Visit     None    Visit Diagnoses    Toe ulcer, left, limited to breakdown of skin ([place])    -  Primary   Type 2 diabetes, controlled, with neuropathy ([place])       Hammer toe of left foot         -Examined patient  -Re-discussed the progression of the wounds and treatment alternatives. - Excisionally dedbrided ulceration at Left 1st toe to healthy bleeding borders removing nonviable tissue using a sterile chisel blade. Wound measures post debridement as above. -Applied iodosorb and dry sterile dressing and instructed patient to continue with daily dressings at home consisting of the same with help from home nursing 2-3x per week - Advised patient to go to the ER or return to office if the wound worsens or if constitutional symptoms are present. -Patient to return to office in 2-3 weeks for follow up wound care and for diabetic shoe as scheduled or sooner if problems arise.  Landis Martins, DPM

## 2019-05-16 ENCOUNTER — Other Ambulatory Visit: Payer: Self-pay | Admitting: Sports Medicine

## 2019-05-16 ENCOUNTER — Telehealth: Payer: Self-pay

## 2019-05-16 ENCOUNTER — Telehealth: Payer: Self-pay | Admitting: *Deleted

## 2019-05-16 MED ORDER — SULFAMETHOXAZOLE-TRIMETHOPRIM 400-80 MG PO TABS: 1.0000 | ORAL_TABLET | Freq: Two times a day (BID) | ORAL | 0 refills | Status: DC

## 2019-05-16 NOTE — Progress Notes (Signed)
Sent antibiotics for celliulitis for toe

## 2019-05-16 NOTE — Telephone Encounter (Signed)
I sent antibiotics to pharmacy for infection

## 2019-05-16 NOTE — Telephone Encounter (Signed)
Left message for [facility] Colletta Maryland to call with more information.

## 2019-05-16 NOTE — Telephone Encounter (Signed)
Pt's home phone line was busy. I called the mobile listed phone and Ms Shannon [name]' voicemail connected and I left a message stating I was trying to get in touch with her mother.

## 2019-05-16 NOTE — Telephone Encounter (Signed)
Shannon [name] from [facility] called stating the Pt's Left 2nd toe has cellulitis. Shannon [name] states the pt's vital signs are stable, no fever, but the toe is red and warm to the touch. Please advice

## 2019-05-16 NOTE — Telephone Encounter (Signed)
[facility] - Shannon [name] states pt's left 2nd toe appears to have cellulitis.

## 2019-05-16 NOTE — Telephone Encounter (Signed)
I sent bactrim antibiotic to pharmacy and continue with iodosorb to the 2nd toe

## 2019-05-16 NOTE — Telephone Encounter (Signed)
[facility] - Colletta Maryland called states she missed by call. Colletta Maryland called and states pt had a blister to left 2nd toe from the end of the nailbed to where the to connects with the foot is redness, and she is using the iodosorb, that was being used on the left 1st toe that is now doing well. I instructed Colletta Maryland to use the iodosorb to the left 2nd toe and I would inform Dr. Cannon Kettle and call again with any new instructions.

## 2019-05-17 ENCOUNTER — Telehealth: Payer: Self-pay

## 2019-05-17 NOTE — Telephone Encounter (Signed)
I spoke with Colletta Maryland [facility] and told her to continue the care ordered yesterday, and I would inform pt of the antibiotic called to the pharmacy.

## 2019-05-17 NOTE — Telephone Encounter (Signed)
Left message informing Ms Gerilyn Nestle, pt's dtr, that her mobile phone is listed as pt's phone.

## 2019-05-17 NOTE — Telephone Encounter (Signed)
Called and spoke with Dory Larsen from [facility] about the abx rx that was sent to the Pt's pharmacy. Colletta Maryland states she will contact the Pt to inform her of the Rx.

## 2019-05-17 NOTE — Telephone Encounter (Signed)
Called Pt and notified her of the abx sent to her pharmacy

## 2019-05-17 NOTE — Telephone Encounter (Signed)
I called Shannon [name] on her home phone Shannon [name] of [place] states she will have to have someone go get Shannon [name]. Margreta Journey states Shannon [name] is in the shower and wanted to know if she could get a call back. I told Margreta Journey I was calling to let Shannon [name] know Dr. Cannon Kettle had sent in an antibiotic and for Shannon [name] to pick up. Margreta Journey states she will have someone pick up the antibiotic for the Shannon [name].

## 2019-05-17 NOTE — Telephone Encounter (Signed)
-----   Message from Mack Hook, LPN sent at [id number]  2:24 PM EST ----- Shannon [name],  March Rummage with [facility] called regarding this patient of Dr. Cannon Kettle.  She was concerned for cellulitis 2nd left toe, stated that toe is red and warm to touch.  She would like for you to call her please [phone]  thanks!

## 2019-05-19 ENCOUNTER — Telehealth: Payer: Self-pay

## 2019-05-19 NOTE — Telephone Encounter (Signed)
[facility] Nurse called stating if Dr. Cannon Kettle is aware that the new abx prescribed to the Pt (bactrim) has potential interactions with the Pt's Losartan and Potassium chloride.

## 2019-05-19 NOTE — Telephone Encounter (Signed)
[place] And advised her of the Dr's message, that the Dr. Gwyndolyn Kaufman about the possible interactions, and the Pt has been on the same abx before, Shannon [name] stated understanding.  Shannon [name] requested verbal orders for Lt 2nd toe ulcer. Verbal orders were given; Iodosorb 2-3 times per week

## 2019-05-19 NOTE — Telephone Encounter (Signed)
I am aware. She has had this antibiotic before without issues. Benefit outweighs risk.

## 2019-05-25 ENCOUNTER — Ambulatory Visit: Payer: Medicare PPO | Admitting: Sports Medicine

## 2019-05-31 ENCOUNTER — Other Ambulatory Visit: Payer: Medicare PPO | Admitting: Orthotics

## 2019-06-09 ENCOUNTER — Ambulatory Visit: Payer: Medicare PPO | Admitting: Sports Medicine

## 2019-08-02 ENCOUNTER — Other Ambulatory Visit: Payer: Medicare PPO | Admitting: Orthotics

## 2019-08-15 ENCOUNTER — Ambulatory Visit: Payer: Medicare PPO | Admitting: Sports Medicine

## 2019-08-22 ENCOUNTER — Encounter: Payer: Self-pay | Admitting: Sports Medicine

## 2019-08-22 ENCOUNTER — Other Ambulatory Visit: Payer: Self-pay

## 2019-08-22 ENCOUNTER — Ambulatory Visit (INDEPENDENT_AMBULATORY_CARE_PROVIDER_SITE_OTHER): Payer: Medicare PPO | Admitting: Sports Medicine

## 2019-08-22 DIAGNOSIS — L03032 Cellulitis of left toe: Secondary | ICD-10-CM | POA: Diagnosis not present

## 2019-08-22 DIAGNOSIS — M79674 Pain in right toe(s): Secondary | ICD-10-CM | POA: Diagnosis not present

## 2019-08-22 DIAGNOSIS — E1140 Type 2 diabetes mellitus with diabetic neuropathy, unspecified: Secondary | ICD-10-CM | POA: Diagnosis not present

## 2019-08-22 DIAGNOSIS — B351 Tinea unguium: Secondary | ICD-10-CM | POA: Diagnosis not present

## 2019-08-22 DIAGNOSIS — L02612 Cutaneous abscess of left foot: Secondary | ICD-10-CM | POA: Diagnosis not present

## 2019-08-22 DIAGNOSIS — M79675 Pain in left toe(s): Secondary | ICD-10-CM

## 2019-08-22 DIAGNOSIS — L97521 Non-pressure chronic ulcer of other part of left foot limited to breakdown of skin: Secondary | ICD-10-CM | POA: Diagnosis not present

## 2019-08-22 MED ORDER — SULFAMETHOXAZOLE-TRIMETHOPRIM 400-80 MG PO TABS: 1.0000 | ORAL_TABLET | Freq: Two times a day (BID) | ORAL | 0 refills | Status: DC

## 2019-08-22 NOTE — Progress Notes (Signed)
Subjective: Shannon [name] is a 79 y.o. female patient seen in office for follow up evaluation of ulceration of the left 1st toe and now new area on left 2nd toe. Patient has a history of diabetes and a blood glucose level today not recorded. Reports that she has moved to a new assisted living facility [facility] and states that they just started care there and home nurse has come out but she is not sure of the schedule. Patient also requests nail trim. Patient has no other pedal complaints at this time.  Patient Active Problem List   Diagnosis Date Noted  . Depression 10/15/2016   Current Outpatient Medications on File Prior to Visit  Medication Sig Dispense Refill  . acetaminophen-codeine (TYLENOL #3) 300-30 MG tablet Take 1 tablet by mouth 2 (two) times daily as needed.    Marland Kitchen aspirin EC 81 MG tablet Take 81 mg by mouth daily.    Marland Kitchen azithromycin (ZITHROMAX) 250 MG tablet Take 250 mg by mouth as directed.    . butalbital-acetaminophen-caffeine (FIORICET WITH CODEINE) 50-325-40-30 MG capsule Take 1 capsule by mouth every 4 (four) hours as needed for headache.    . carbamide peroxide (DEBROX) 6.5 % OTIC solution 5 drops 2 (two) times daily.    . celecoxib (CELEBREX) 200 MG capsule     . cetirizine (ZYRTEC) 10 MG tablet Take 10 mg by mouth daily.    . citalopram (CELEXA) 20 MG tablet Take 20 mg by mouth daily.    . clotrimazole (LOTRIMIN) 1 % cream Apply 1 application topically 2 (two) times daily.    Marland Kitchen donepezil (ARICEPT) 5 MG tablet Take 5 mg by mouth daily.    Marland Kitchen EPINEPHrine (ADRENALIN) 0.1 % nasal solution Place 1 drop into the nose once.    Marland Kitchen EPINEPHrine 0.3 mg/0.3 mL IJ SOAJ injection SMARTSIG:0.3 Milliliter(s) IM Once PRN    . fluticasone (FLONASE) 50 MCG/ACT nasal spray     . furosemide (LASIX) 20 MG tablet Take 20 mg by mouth.    . gabapentin (NEURONTIN) 600 MG tablet Take 600 mg by mouth 3 (three) times daily.    Marland Kitchen HYDROcodone-acetaminophen (NORCO/VICODIN) 5-325 MG tablet Take 1 tablet by  mouth every 6 (six) hours as needed for moderate pain.    . INVOKANA 300 MG TABS     . LANTUS SOLOSTAR 100 UNIT/ML Solostar Pen     . lidocaine (LIDODERM) 5 %     . LORazepam (ATIVAN) 1 MG tablet Take 1 mg by mouth every 8 (eight) hours.    Marland Kitchen losartan (COZAAR) 50 MG tablet     . metFORMIN (GLUCOPHAGE) 500 MG tablet     . MYRBETRIQ 25 MG TB24 tablet 50 mg.     . NOVOFINE 32G X 6 MM MISC     . nystatin (MYCOSTATIN/NYSTOP) 100000 UNIT/GM POWD     . omeprazole (PRILOSEC) 20 MG capsule     . ONGLYZA 5 MG TABS tablet     . Polyethyl Glycol-Propyl Glycol (SYSTANE) 0.4-0.3 % GEL ophthalmic gel Place 1 application into both eyes.    . potassium chloride SA (KLOR-CON) 20 MEQ tablet Take 20 mEq by mouth daily.    . Prodigy Lancets 28G MISC     . Skin Protectants, Misc. (MINERIN) CREA Apply topically.    . traMADol (ULTRAM-ER) 200 MG 24 hr tablet     . traZODone (DESYREL) 100 MG tablet Take 100 mg by mouth at bedtime.    . triamcinolone cream (KENALOG) 0.1 % Apply  1 application topically 2 (two) times daily.    . Wound Dressings (MEDIHONEY WOUND/BURN DRESSING) GEL Apply to left toe ulceration 3x per week 15 mL 1  . amoxicillin (AMOXIL) 500 MG tablet Take 500 mg by mouth 2 (two) times daily.     No current facility-administered medications on file prior to visit.   Allergies  Allergen Reactions  . Peanut Oil Anaphylaxis  . Demerol  [Meperidine Hcl] Nausea And Vomiting  . Other Hives    Takes skin off  . Demerol [Meperidine]   . Peanut Butter Flavor   . Tape Dermatitis    No results found for this or any previous visit (from the past 2160 hour(s)).  Objective: There were no vitals filed for this visit.  General: Patient is awake, alert, oriented x 3 and in no acute distress.  Dermatology: Skin is warm and dry bilateral with a partial thickness ulceration present left 1st toe. Ulceration measures distal tuft 0.5cm x0.5cm x0.1cm (larger than previous) with a granular base. The ulcerations  does not probe to bone. There is no malodor, no active drainage, + ulcer at left 2nd toe medial aspect partial thickness with granular base that measures 0.5x0.5cm with focal erythema that's blanchable and decreased edema. No other acute signs of infection.  Nails elongated and thickness bilateral.   Vascular: Dorsalis Pedis pulse = 1/4 Bilateral,  Posterior Tibial pulse = 1/4 Bilateral,  Capillary Fill Time < 5 seconds  Neurologic: Protective sensation intact diminished to the level of midfoot using the 5.07/10g [employer].  Musculosketal: There is mild pain with palpation to ulcerated area to left 1st toe. No pain with compression to calves bilateral. + Hammertoe/digital bony deformities noted bilateral.  Pes planus foot type bilateral.  No results for input(s): GRAMSTAIN, LABORGA in the last 8760 hours.  Assessment and Plan:  Problem List Items Addressed This Visit    None    Visit Diagnoses    Toe ulcer, left, limited to breakdown of skin ([place])    -  Primary   Pain due to onychomycosis of toenails of both feet       Relevant Medications   azithromycin (ZITHROMAX) 250 MG tablet   sulfamethoxazole-trimethoprim (BACTRIM) 400-80 MG tablet   Cellulitis and abscess of toe of left foot       Type 2 diabetes, controlled, with neuropathy ([place])         -Examined patient  -Re-discussed the progression of the wounds and treatment alternatives. - Excisionally dedbrided ulceration at Left 1st and 2nd toe to healthy bleeding borders removing nonviable tissue using a sterile chisel blade. Wound measures post debridement as above. -Applied iodosorb and dry sterile dressing and instructed patient to continue with daily dressings at home consisting of the same with help from home nursing 2-3x per week; Orders to be sent to [facility] -Rx Bactrim for erythema to toes - Advised patient to go to the ER or return to office if the wound worsens or if constitutional symptoms are  present. -Mechanically debrided all nails x9 using sterile nail nipper without incident  -Patient to return to office in 3 weeks for follow up wound care and for diabetic shoe as scheduled or sooner if problems arise.  Landis Martins, DPM

## 2019-08-23 ENCOUNTER — Telehealth: Payer: Self-pay | Admitting: *Deleted

## 2019-08-23 NOTE — Telephone Encounter (Signed)
Left message [facility] to call to advise of a pt's [place] status.

## 2019-08-23 NOTE — Telephone Encounter (Signed)
Left message for pt to call with [facility] nurse information.

## 2019-08-23 NOTE — Telephone Encounter (Signed)
-----   Message from Shannon [name], [place] sent at 08/22/2019  8:40 PM EDT ----- Regarding: Orders: [facility] assisted living Send orders for nurse that comes to see patient in independent living to apply iodosorb and dry dressing at left 1st and 2nd toes 2x per week Also make sure patient starts Bactrim. An rx was given to the patient when she was in office to get this antibiotic filled and to start for blanchable erythema to toes on left.

## 2019-08-23 NOTE — Telephone Encounter (Signed)
Pt called states she has [place] 2 times a week and believes it is with [facility]. I informed pt Dr. Cannon Kettle had wanted pt to begin Bactrim. Pt states she gave the written prescription to the [facility].

## 2019-08-23 NOTE — Telephone Encounter (Signed)
Faxed copy of DR. Stover's 08/22/2019 8:43pm orders to [facility].

## 2019-08-24 NOTE — Telephone Encounter (Signed)
I spoke with [name] states she took the orders from the doctor note.

## 2019-09-12 ENCOUNTER — Ambulatory Visit (INDEPENDENT_AMBULATORY_CARE_PROVIDER_SITE_OTHER): Payer: Medicare PPO | Admitting: Sports Medicine

## 2019-09-12 ENCOUNTER — Other Ambulatory Visit: Payer: Self-pay

## 2019-09-12 ENCOUNTER — Encounter: Payer: Self-pay | Admitting: Sports Medicine

## 2019-09-12 VITALS — Temp 97.0°F

## 2019-09-12 DIAGNOSIS — E1140 Type 2 diabetes mellitus with diabetic neuropathy, unspecified: Secondary | ICD-10-CM

## 2019-09-12 DIAGNOSIS — L97521 Non-pressure chronic ulcer of other part of left foot limited to breakdown of skin: Secondary | ICD-10-CM | POA: Diagnosis not present

## 2019-09-12 DIAGNOSIS — M2040 Other hammer toe(s) (acquired), unspecified foot: Secondary | ICD-10-CM | POA: Diagnosis not present

## 2019-09-12 NOTE — Progress Notes (Addendum)
Subjective: Shannon [name] is a 79 y.o. female patient seen in office for follow up evaluation of ulceration of the left 1st and second toes.  Patient reports that the nurses have been changing the dressing but did not come on yesterday.  Patient denies nausea vomiting fever chills or any other constitutional symptoms at this time.  Reports that she has finished her Bactrim antibiotic without any issues. Patient has no other pedal complaints at this time.  Patient Active Problem List   Diagnosis Date Noted  . Depression 10/15/2016   Current Outpatient Medications on File Prior to Visit  Medication Sig Dispense Refill  . acetaminophen-codeine (TYLENOL #3) 300-30 MG tablet Take 1 tablet by mouth 2 (two) times daily as needed.    Marland Kitchen aspirin EC 81 MG tablet Take 81 mg by mouth daily.    . butalbital-acetaminophen-caffeine (FIORICET WITH CODEINE) 50-325-40-30 MG capsule Take 1 capsule by mouth every 4 (four) hours as needed for headache.    . carbamide peroxide (DEBROX) 6.5 % OTIC solution 5 drops 2 (two) times daily.    . celecoxib (CELEBREX) 200 MG capsule     . cetirizine (ZYRTEC) 10 MG tablet Take 10 mg by mouth daily.    . citalopram (CELEXA) 20 MG tablet Take 20 mg by mouth daily.    . clotrimazole (LOTRIMIN) 1 % cream Apply 1 application topically 2 (two) times daily.    Marland Kitchen donepezil (ARICEPT) 5 MG tablet Take 5 mg by mouth daily.    Marland Kitchen EPINEPHrine (ADRENALIN) 0.1 % nasal solution Place 1 drop into the nose once.    Marland Kitchen EPINEPHrine 0.3 mg/0.3 mL IJ SOAJ injection SMARTSIG:0.3 Milliliter(s) IM Once PRN    . fluticasone (FLONASE) 50 MCG/ACT nasal spray     . furosemide (LASIX) 20 MG tablet Take 20 mg by mouth.    . gabapentin (NEURONTIN) 600 MG tablet Take 600 mg by mouth 3 (three) times daily.    Marland Kitchen HYDROcodone-acetaminophen (NORCO/VICODIN) 5-325 MG tablet Take 1 tablet by mouth every 6 (six) hours as needed for moderate pain.    . INVOKANA 300 MG TABS     . LANTUS SOLOSTAR 100 UNIT/ML Solostar  Pen     . lidocaine (LIDODERM) 5 %     . LORazepam (ATIVAN) 1 MG tablet Take 1 mg by mouth every 8 (eight) hours.    Marland Kitchen losartan (COZAAR) 50 MG tablet     . metFORMIN (GLUCOPHAGE) 500 MG tablet     . MYRBETRIQ 25 MG TB24 tablet 50 mg.     . NOVOFINE 32G X 6 MM MISC     . nystatin (MYCOSTATIN/NYSTOP) 100000 UNIT/GM POWD     . omeprazole (PRILOSEC) 20 MG capsule     . ONGLYZA 5 MG TABS tablet     . Polyethyl Glycol-Propyl Glycol (SYSTANE) 0.4-0.3 % GEL ophthalmic gel Place 1 application into both eyes.    . potassium chloride SA (KLOR-CON) 20 MEQ tablet Take 20 mEq by mouth daily.    . Prodigy Lancets 28G MISC     . Skin Protectants, Misc. (MINERIN) CREA Apply topically.    . traMADol (ULTRAM-ER) 200 MG 24 hr tablet     . traZODone (DESYREL) 100 MG tablet Take 100 mg by mouth at bedtime.    . triamcinolone cream (KENALOG) 0.1 % Apply 1 application topically 2 (two) times daily.    . Wound Dressings (MEDIHONEY WOUND/BURN DRESSING) GEL Apply to left toe ulceration 3x per week 15 mL 1  . amoxicillin (  AMOXIL) 500 MG tablet Take 500 mg by mouth 2 (two) times daily.    Marland Kitchen azithromycin (ZITHROMAX) 250 MG tablet Take 250 mg by mouth as directed.     No current facility-administered medications on file prior to visit.   Allergies  Allergen Reactions  . Peanut Oil Anaphylaxis  . Demerol  [Meperidine Hcl] Nausea And Vomiting  . Other Hives    Takes skin off  . Demerol [Meperidine]   . Peanut Butter Flavor   . Tape Dermatitis    No results found for this or any previous visit (from the past 2160 hour(s)).  Objective: There were no vitals filed for this visit.  General: Patient is awake, alert, oriented x 3 and in no acute distress.  Dermatology: Skin is warm and dry bilateral with a partial thickness ulceration present left 1st toe. Ulceration measures distal tuft 0.3cm x0.3cm x0.1cm (smaller than previous) with a granular base. The ulcerations does not probe to bone. There is no malodor, no  active drainage, + ulcer at left 2nd toe medial aspect partial thickness with granular base that measures 0.3x0.3cm (smaller in size) with decreased erythema and decreased edema. No other acute signs of infection.  Nails short and thick bilateral.   Vascular: Dorsalis Pedis pulse = 1/4 Bilateral,  Posterior Tibial pulse = 1/4 Bilateral,  Capillary Fill Time < 5 seconds  Neurologic: Protective sensation intact diminished to the level of midfoot using the 5.07/10g [employer].  Musculosketal: There is no pain with palpation to ulcerated area to left 1st or 2nd toe. No pain with compression to calves bilateral. + Hammertoe/digital bony deformities noted bilateral.  Pes planus foot type bilateral.  No results for input(s): GRAMSTAIN, LABORGA in the last 8760 hours.  Assessment and Plan:  Problem List Items Addressed This Visit    None    Visit Diagnoses    Toe ulcer, left, limited to breakdown of skin ([facility])    -  Primary   Type 2 diabetes, controlled, with neuropathy ([place])       Hammer toe, unspecified laterality         -Examined patient  -Re-discussed the progression of the wounds and treatment alternatives. -Debridement not performed at today's visit since wound bed was very viable with very minimal surrounding buildup of keratotic tissue -Applied iodosorb and dry sterile dressing and instructed patient to continue with daily dressings at home consisting of the same with help from home nursing 2-3x per week as previously ordered - Advised patient to go to the ER or return to office if the wound worsens or if constitutional symptoms are present. -Continue  with postoperative shoe -Patient to return to office in 3 weeks for follow up wound care or sooner if problems arise.  Landis Martins, DPM

## 2019-09-21 ENCOUNTER — Other Ambulatory Visit: Payer: Self-pay | Admitting: Nurse Practitioner

## 2019-09-21 DIAGNOSIS — Z1231 Encounter for screening mammogram for malignant neoplasm of breast: Secondary | ICD-10-CM

## 2019-09-28 ENCOUNTER — Other Ambulatory Visit: Payer: Self-pay

## 2019-09-28 ENCOUNTER — Ambulatory Visit
Admission: RE | Admit: 2019-09-28 | Discharge: 2019-09-28 | Disposition: A | Discharge status: Home / Self Care | Payer: Medicare PPO | Source: Ambulatory Visit | Attending: Nurse Practitioner | Admitting: Nurse Practitioner

## 2019-09-28 DIAGNOSIS — Z1231 Encounter for screening mammogram for malignant neoplasm of breast: Secondary | ICD-10-CM

## 2019-10-03 ENCOUNTER — Encounter: Payer: Self-pay | Admitting: Sports Medicine

## 2019-10-03 ENCOUNTER — Ambulatory Visit (INDEPENDENT_AMBULATORY_CARE_PROVIDER_SITE_OTHER): Payer: Medicare PPO | Admitting: Sports Medicine

## 2019-10-03 ENCOUNTER — Other Ambulatory Visit: Payer: Self-pay

## 2019-10-03 VITALS — Temp 96.3°F

## 2019-10-03 DIAGNOSIS — E1140 Type 2 diabetes mellitus with diabetic neuropathy, unspecified: Secondary | ICD-10-CM | POA: Diagnosis not present

## 2019-10-03 DIAGNOSIS — L97521 Non-pressure chronic ulcer of other part of left foot limited to breakdown of skin: Secondary | ICD-10-CM

## 2019-10-03 DIAGNOSIS — M2040 Other hammer toe(s) (acquired), unspecified foot: Secondary | ICD-10-CM

## 2019-10-03 NOTE — Progress Notes (Signed)
Subjective: Shannon [name] is a 79 y.o. female patient seen in office for follow up evaluation of ulceration of the left 1st and second toes.  Patient reports that the home nurses have stopped coming and she has been doing the wound care herself at her assisted living.  Patient denies any increase in pain or odor nausea vomiting fever or chills at this time.  Patient has no other pedal complaints at this time.  Patient Active Problem List   Diagnosis Date Noted  . Depression 10/15/2016   Current Outpatient Medications on File Prior to Visit  Medication Sig Dispense Refill  . acetaminophen-codeine (TYLENOL #3) 300-30 MG tablet Take 1 tablet by mouth 2 (two) times daily as needed.    Marland Kitchen amoxicillin (AMOXIL) 500 MG tablet Take 500 mg by mouth 2 (two) times daily.    Marland Kitchen aspirin EC 81 MG tablet Take 81 mg by mouth daily.    . butalbital-acetaminophen-caffeine (FIORICET WITH CODEINE) 50-325-40-30 MG capsule Take 1 capsule by mouth every 4 (four) hours as needed for headache.    . carbamide peroxide (DEBROX) 6.5 % OTIC solution 5 drops 2 (two) times daily.    . celecoxib (CELEBREX) 200 MG capsule     . cetirizine (ZYRTEC) 10 MG tablet Take 10 mg by mouth daily.    . citalopram (CELEXA) 20 MG tablet Take 20 mg by mouth daily.    . clotrimazole (LOTRIMIN) 1 % cream Apply 1 application topically 2 (two) times daily.    Marland Kitchen donepezil (ARICEPT) 5 MG tablet Take 5 mg by mouth daily.    Marland Kitchen EPINEPHrine (ADRENALIN) 0.1 % nasal solution Place 1 drop into the nose once.    Marland Kitchen EPINEPHrine 0.3 mg/0.3 mL IJ SOAJ injection SMARTSIG:0.3 Milliliter(s) IM Once PRN    . fluticasone (FLONASE) 50 MCG/ACT nasal spray     . furosemide (LASIX) 20 MG tablet Take 20 mg by mouth.    . gabapentin (NEURONTIN) 600 MG tablet Take 600 mg by mouth 3 (three) times daily.    Marland Kitchen HYDROcodone-acetaminophen (NORCO/VICODIN) 5-325 MG tablet Take 1 tablet by mouth every 6 (six) hours as needed for moderate pain.    . INVOKANA 300 MG TABS     .  LANTUS SOLOSTAR 100 UNIT/ML Solostar Pen     . lidocaine (LIDODERM) 5 %     . LORazepam (ATIVAN) 1 MG tablet Take 1 mg by mouth every 8 (eight) hours.    Marland Kitchen losartan (COZAAR) 50 MG tablet     . metFORMIN (GLUCOPHAGE) 500 MG tablet     . MYRBETRIQ 25 MG TB24 tablet 50 mg.     . NOVOFINE 32G X 6 MM MISC     . nystatin (MYCOSTATIN/NYSTOP) 100000 UNIT/GM POWD     . omeprazole (PRILOSEC) 20 MG capsule     . ONGLYZA 5 MG TABS tablet     . Polyethyl Glycol-Propyl Glycol (SYSTANE) 0.4-0.3 % GEL ophthalmic gel Place 1 application into both eyes.    . potassium chloride SA (KLOR-CON) 20 MEQ tablet Take 20 mEq by mouth daily.    . Prodigy Lancets 28G MISC     . Skin Protectants, Misc. (MINERIN) CREA Apply topically.    . traMADol (ULTRAM-ER) 200 MG 24 hr tablet     . traZODone (DESYREL) 100 MG tablet Take 100 mg by mouth at bedtime.    . triamcinolone cream (KENALOG) 0.1 % Apply 1 application topically 2 (two) times daily.    . Wound Dressings ([place] WOUND/BURN DRESSING)  GEL Apply to left toe ulceration 3x per week 15 mL 1  . azithromycin (ZITHROMAX) 250 MG tablet Take 250 mg by mouth as directed.     No current facility-administered medications on file prior to visit.   Allergies  Allergen Reactions  . Peanut Oil Anaphylaxis  . Demerol  [Meperidine Hcl] Nausea And Vomiting  . Other Hives    Takes skin off  . Demerol [Meperidine]   . Peanut Butter Flavor   . Tape Dermatitis    No results found for this or any previous visit (from the past 2160 hour(s)).  Objective: There were no vitals filed for this visit.  General: Patient is awake, alert, oriented x 3 and in no acute distress.  Dermatology: Skin is warm and dry bilateral with a partial thickness ulceration present left 1st toe. Ulceration measures distal tuft 0.2cm x0.2cm x0.1cm (smaller than previous) with a granular base. The ulcerations does not probe to bone. There is no malodor, no active drainage, + ulcer at left 2nd toe  medial aspect partial thickness with granular base that measures 0.2x0.2cm (smaller in size) with decreased erythema and decreased edema. No other acute signs of infection.  Nails short and thick bilateral.   Vascular: Dorsalis Pedis pulse = 1/4 Bilateral,  Posterior Tibial pulse = 1/4 Bilateral,  Capillary Fill Time < 5 seconds  Neurologic: Protective sensation intact diminished to the level of midfoot using the 5.07/10g [employer].  Musculosketal: There is no pain with palpation to ulcerated area to left 1st or 2nd toe. No pain with compression to calves bilateral. + Hammertoe/digital bony deformities noted bilateral.  Pes planus foot type bilateral.  No results for input(s): GRAMSTAIN, LABORGA in the last 8760 hours.  Assessment and Plan:  Problem List Items Addressed This Visit    None    Visit Diagnoses    Toe ulcer, left, limited to breakdown of skin ([place])    -  Primary   Type 2 diabetes, controlled, with neuropathy ([place])       Hammer toe, unspecified laterality         -Examined patient  -Re-discussed the progression of the wounds and treatment alternatives. -Mechanically debrided ulceration at distal tuft of left great toe and medial second toe to healthy bleeding borders removing all nonviable tissue.  Hemostasis was achieved with manual pressure with minimal blood loss noted.  Wounds were measured as above post debridement.  Patient tolerated the debridement procedure well without need for anesthesia. -Applied iodosorb and dry sterile dressing and instructed patient to continue with daily dressings at home consisting of the same herself as she is able to - Advised patient to go to the ER or return to office if the wound worsens or if constitutional symptoms are present. -Continue  with postoperative shoe -Patient to return to office in 3 weeks for follow up wound care or sooner if problems arise.  Landis Martins, DPM

## 2019-10-31 ENCOUNTER — Encounter: Payer: Self-pay | Admitting: Sports Medicine

## 2019-10-31 ENCOUNTER — Other Ambulatory Visit: Payer: Self-pay

## 2019-10-31 ENCOUNTER — Ambulatory Visit (INDEPENDENT_AMBULATORY_CARE_PROVIDER_SITE_OTHER): Payer: Medicare PPO | Admitting: Sports Medicine

## 2019-10-31 VITALS — Temp 97.3°F

## 2019-10-31 DIAGNOSIS — E1140 Type 2 diabetes mellitus with diabetic neuropathy, unspecified: Secondary | ICD-10-CM

## 2019-10-31 DIAGNOSIS — L97521 Non-pressure chronic ulcer of other part of left foot limited to breakdown of skin: Secondary | ICD-10-CM

## 2019-10-31 DIAGNOSIS — M2040 Other hammer toe(s) (acquired), unspecified foot: Secondary | ICD-10-CM

## 2019-10-31 NOTE — Progress Notes (Signed)
Subjective: Shannon [name] is a 79 y.o. female patient seen in office for follow up evaluation of ulceration of the left 1st and second toes.  Patient reports that she has been doing her dressing and has noticed improvement and that the sores continue to get smaller has been applying Iodosorb as much as she can denies any nausea vomiting fever chills or any other constitutional symptoms at this time.  Patient has no other pedal complaints at this time.  Patient Active Problem List   Diagnosis Date Noted  . Depression 10/15/2016   Current Outpatient Medications on File Prior to Visit  Medication Sig Dispense Refill  . acetaminophen-codeine (TYLENOL #3) 300-30 MG tablet Take 1 tablet by mouth 2 (two) times daily as needed.    Marland Kitchen amoxicillin (AMOXIL) 500 MG tablet Take 500 mg by mouth 2 (two) times daily.    Marland Kitchen aspirin EC 81 MG tablet Take 81 mg by mouth daily.    Marland Kitchen azithromycin (ZITHROMAX) 250 MG tablet Take 250 mg by mouth as directed.    . butalbital-acetaminophen-caffeine (FIORICET WITH CODEINE) 50-325-40-30 MG capsule Take 1 capsule by mouth every 4 (four) hours as needed for headache.    . carbamide peroxide (DEBROX) 6.5 % OTIC solution 5 drops 2 (two) times daily.    . celecoxib (CELEBREX) 200 MG capsule     . cetirizine (ZYRTEC) 10 MG tablet Take 10 mg by mouth daily.    . citalopram (CELEXA) 20 MG tablet Take 20 mg by mouth daily.    . clotrimazole (LOTRIMIN) 1 % cream Apply 1 application topically 2 (two) times daily.    Marland Kitchen donepezil (ARICEPT) 5 MG tablet Take 5 mg by mouth daily.    Marland Kitchen EPINEPHrine (ADRENALIN) 0.1 % nasal solution Place 1 drop into the nose once.    Marland Kitchen EPINEPHrine 0.3 mg/0.3 mL IJ SOAJ injection SMARTSIG:0.3 Milliliter(s) IM Once PRN    . fluticasone (FLONASE) 50 MCG/ACT nasal spray     . furosemide (LASIX) 20 MG tablet Take 20 mg by mouth.    . gabapentin (NEURONTIN) 600 MG tablet Take 600 mg by mouth 3 (three) times daily.    Marland Kitchen HYDROcodone-acetaminophen (NORCO/VICODIN)  5-325 MG tablet Take 1 tablet by mouth every 6 (six) hours as needed for moderate pain.    . INVOKANA 300 MG TABS     . LANTUS SOLOSTAR 100 UNIT/ML Solostar Pen     . lidocaine (LIDODERM) 5 %     . LORazepam (ATIVAN) 1 MG tablet Take 1 mg by mouth every 8 (eight) hours.    Marland Kitchen losartan (COZAAR) 50 MG tablet     . metFORMIN (GLUCOPHAGE) 500 MG tablet     . MYRBETRIQ 25 MG TB24 tablet 50 mg.     . NOVOFINE 32G X 6 MM MISC     . nystatin (MYCOSTATIN/NYSTOP) 100000 UNIT/GM POWD     . omeprazole (PRILOSEC) 20 MG capsule     . ONGLYZA 5 MG TABS tablet     . Polyethyl Glycol-Propyl Glycol (SYSTANE) 0.4-0.3 % GEL ophthalmic gel Place 1 application into both eyes.    . potassium chloride SA (KLOR-CON) 20 MEQ tablet Take 20 mEq by mouth daily.    . Prodigy Lancets 28G MISC     . Skin Protectants, Misc. (MINERIN) CREA Apply topically.    . traMADol (ULTRAM-ER) 200 MG 24 hr tablet     . traZODone (DESYREL) 100 MG tablet Take 100 mg by mouth at bedtime.    . triamcinolone  cream (KENALOG) 0.1 % Apply 1 application topically 2 (two) times daily.    . Wound Dressings (MEDIHONEY WOUND/BURN DRESSING) GEL Apply to left toe ulceration 3x per week 15 mL 1   No current facility-administered medications on file prior to visit.   Allergies  Allergen Reactions  . Peanut Oil Anaphylaxis  . Demerol  [Meperidine Hcl] Nausea And Vomiting  . Other Hives    Takes skin off  . Demerol [Meperidine]   . Peanut Butter Flavor   . Tape Dermatitis    No results found for this or any previous visit (from the past 2160 hour(s)).  Objective: There were no vitals filed for this visit.  General: Patient is awake, alert, oriented x 3 and in no acute distress.  Dermatology: Skin is warm and dry bilateral with a partial thickness ulceration present left 1st toe. Ulceration measures distal tuft 0.1cm x0.2cm x0.1cm (smaller than previous) with a granular base. The ulcerations does not probe to bone. There is no malodor, no  active drainage, + ulcer at left 2nd toe medial aspect partial thickness with granular base that measures 0.1x0.2cm (smaller in size) with decreased erythema and decreased edema. No other acute signs of infection.  Nails short and thick bilateral.   Vascular: Dorsalis Pedis pulse = 1/4 Bilateral,  Posterior Tibial pulse = 1/4 Bilateral,  Capillary Fill Time < 5 seconds  Neurologic: Protective sensation intact diminished to the level of midfoot using the 5.07/10g [employer].  Musculosketal: There is no pain with palpation to ulcerated area to left 1st or 2nd toe. No pain with compression to calves bilateral. + Hammertoe/digital bony deformities noted bilateral.  Pes planus foot type bilateral.  No results for input(s): GRAMSTAIN, LABORGA in the last 8760 hours.  Assessment and Plan:  Problem List Items Addressed This Visit    None    Visit Diagnoses    Toe ulcer, left, limited to breakdown of skin ([place])    -  Primary   Type 2 diabetes, controlled, with neuropathy ([place])       Hammer toe, unspecified laterality         -Examined patient  -Re-discussed the progression of the wounds and treatment alternatives. -Mechanically debrided ulceration at distal tuft of left great toe and medial second toe to healthy bleeding borders removing all nonviable tissue.  Hemostasis was achieved with manual pressure with minimal blood loss noted.  Wounds were measured as above post debridement.  Patient tolerated the debridement procedure well without need for anesthesia. -Applied iodosorb and dry sterile dressing and instructed patient to continue with dressings every other day - Advised patient to go to the ER or return to office if the wound worsens or if constitutional symptoms are present. -Continue  with postoperative shoe and advised patient that we cannot fit her for diabetic shoes and to her sores have healed or scabbed over -Patient to return to office in 5-6 weeks for follow up  wound care or sooner if problems arise.  Landis Martins, DPM

## 2019-12-07 ENCOUNTER — Encounter: Payer: Self-pay | Admitting: Sports Medicine

## 2019-12-07 ENCOUNTER — Ambulatory Visit (INDEPENDENT_AMBULATORY_CARE_PROVIDER_SITE_OTHER): Payer: Medicare PPO | Admitting: Sports Medicine

## 2019-12-07 ENCOUNTER — Other Ambulatory Visit: Payer: Self-pay

## 2019-12-07 DIAGNOSIS — L97521 Non-pressure chronic ulcer of other part of left foot limited to breakdown of skin: Secondary | ICD-10-CM | POA: Diagnosis not present

## 2019-12-07 DIAGNOSIS — E1140 Type 2 diabetes mellitus with diabetic neuropathy, unspecified: Secondary | ICD-10-CM

## 2019-12-07 DIAGNOSIS — M2040 Other hammer toe(s) (acquired), unspecified foot: Secondary | ICD-10-CM

## 2019-12-07 NOTE — Progress Notes (Signed)
Subjective: Shannon [name] is a 79 y.o. female patient seen in office for follow up evaluation of ulceration of the left 1st toe and reports that the 2nd toe is healed.  Patient reports that she has been doing her dressing applying Iodosorb with improvement, denies any nausea vomiting fever chills or any other constitutional symptoms at this time.  Patient has no other pedal complaints at this time.  Patient Active Problem List   Diagnosis Date Noted  . Depression 10/15/2016   Current Outpatient Medications on File Prior to Visit  Medication Sig Dispense Refill  . acetaminophen-codeine (TYLENOL #3) 300-30 MG tablet Take 1 tablet by mouth 2 (two) times daily as needed.    Marland Kitchen aspirin EC 81 MG tablet Take 81 mg by mouth daily.    Marland Kitchen azithromycin (ZITHROMAX) 250 MG tablet Take 250 mg by mouth as directed.    . butalbital-acetaminophen-caffeine (FIORICET WITH CODEINE) 50-325-40-30 MG capsule Take 1 capsule by mouth every 4 (four) hours as needed for headache.    . carbamide peroxide (DEBROX) 6.5 % OTIC solution 5 drops 2 (two) times daily.    . celecoxib (CELEBREX) 200 MG capsule     . cetirizine (ZYRTEC) 10 MG tablet Take 10 mg by mouth daily.    . citalopram (CELEXA) 20 MG tablet Take 20 mg by mouth daily.    . clotrimazole (LOTRIMIN) 1 % cream Apply 1 application topically 2 (two) times daily.    Marland Kitchen donepezil (ARICEPT) 5 MG tablet Take 5 mg by mouth daily.    Marland Kitchen EPINEPHrine (ADRENALIN) 0.1 % nasal solution Place 1 drop into the nose once.    Marland Kitchen EPINEPHrine 0.3 mg/0.3 mL IJ SOAJ injection SMARTSIG:0.3 Milliliter(s) IM Once PRN    . fluticasone (FLONASE) 50 MCG/ACT nasal spray     . furosemide (LASIX) 20 MG tablet Take 20 mg by mouth.    . gabapentin (NEURONTIN) 600 MG tablet Take 600 mg by mouth 3 (three) times daily.    Marland Kitchen HYDROcodone-acetaminophen (NORCO/VICODIN) 5-325 MG tablet Take 1 tablet by mouth every 6 (six) hours as needed for moderate pain.    . INVOKANA 300 MG TABS     . LANTUS  SOLOSTAR 100 UNIT/ML Solostar Pen     . lidocaine (LIDODERM) 5 %     . LORazepam (ATIVAN) 1 MG tablet Take 1 mg by mouth every 8 (eight) hours.    Marland Kitchen losartan (COZAAR) 50 MG tablet     . metFORMIN (GLUCOPHAGE) 500 MG tablet     . MYRBETRIQ 25 MG TB24 tablet 50 mg.     . NOVOFINE 32G X 6 MM MISC     . nystatin (MYCOSTATIN/NYSTOP) 100000 UNIT/GM POWD     . omeprazole (PRILOSEC) 20 MG capsule     . ONGLYZA 5 MG TABS tablet     . Polyethyl Glycol-Propyl Glycol (SYSTANE) 0.4-0.3 % GEL ophthalmic gel Place 1 application into both eyes.    . potassium chloride SA (KLOR-CON) 20 MEQ tablet Take 20 mEq by mouth daily.    . Prodigy Lancets 28G MISC     . Skin Protectants, Misc. (MINERIN) CREA Apply topically.    . traMADol (ULTRAM-ER) 200 MG 24 hr tablet     . traZODone (DESYREL) 100 MG tablet Take 100 mg by mouth at bedtime.    . triamcinolone cream (KENALOG) 0.1 % Apply 1 application topically 2 (two) times daily.    . Wound Dressings (MEDIHONEY WOUND/BURN DRESSING) GEL Apply to left toe ulceration 3x per  week 15 mL 1   No current facility-administered medications on file prior to visit.   Allergies  Allergen Reactions  . Peanut Oil Anaphylaxis  . Demerol  [Meperidine Hcl] Nausea And Vomiting  . Other Hives    Takes skin off  . Demerol [Meperidine]   . Peanut Butter Flavor   . Tape Dermatitis    No results found for this or any previous visit (from the past 2160 hour(s)).  Objective: There were no vitals filed for this visit.  General: Patient is awake, alert, oriented x 3 and in no acute distress.  Dermatology: Skin is warm and dry bilateral with a partial thickness ulceration present left 1st toe. Ulceration measures distal tuft 0.1cm x0.1cm x0.1cm (smaller than previous) with a granular base. The ulcerations does not probe to bone. There is no malodor, no active drainage, 2nd toe ulcer on left is healed. No other acute signs of infection.  Nails short and thick bilateral.    Vascular: Dorsalis Pedis pulse = 1/4 Bilateral,  Posterior Tibial pulse = 1/4 Bilateral,  Capillary Fill Time < 5 seconds  Neurologic: Protective sensation intact diminished to the level of midfoot using the 5.07/10g [employer].  Musculosketal: There is no pain with palpation to ulcerated area to left 1st toe. No pain with compression to calves bilateral. + Hammertoe/digital bony deformities noted bilateral.  Pes planus foot type bilateral.  No results for input(s): GRAMSTAIN, LABORGA in the last 8760 hours.  Assessment and Plan:  Problem List Items Addressed This Visit    None    Visit Diagnoses    Toe ulcer, left, limited to breakdown of skin ([place])    -  Primary   Type 2 diabetes, controlled, with neuropathy ([place])       Hammer toe, unspecified laterality         -Examined patient  -Re-discussed the progression of the wound and treatment alternatives. -Mechanically debrided ulceration at distal tuft of left great toe to healthy bleeding borders removing all nonviable tissue.  Hemostasis was achieved with manual pressure with minimal blood loss noted.  Wounds were measured as above post debridement.  Patient tolerated the debridement procedure well without need for anesthesia. -Applied iodosorb and dry sterile dressing and instructed patient to continue with dressings every day with bandaid and may try normal shoe as long as it doesn't rub - Advised patient to go to the ER or return to office if the wound worsens or if constitutional symptoms are present. -Patient to return to office in 4-5 weeks for follow up wound care or sooner if problems arise.  Landis Martins, DPM

## 2019-12-14 ENCOUNTER — Ambulatory Visit: Payer: Medicare PPO | Admitting: Sports Medicine

## 2019-12-18 ENCOUNTER — Encounter: Payer: Self-pay | Admitting: Sports Medicine

## 2019-12-18 ENCOUNTER — Other Ambulatory Visit: Payer: Self-pay | Admitting: Sports Medicine

## 2019-12-18 ENCOUNTER — Telehealth (INDEPENDENT_AMBULATORY_CARE_PROVIDER_SITE_OTHER): Payer: Medicare PPO | Admitting: Podiatry

## 2019-12-18 MED ORDER — SULFAMETHOXAZOLE-TRIMETHOPRIM 400-80 MG PO TABS
1.0000 | ORAL_TABLET | Freq: Two times a day (BID) | ORAL | 0 refills | Status: DC
Start: 2019-12-18 — End: 2020-07-04

## 2019-12-18 NOTE — Telephone Encounter (Signed)
Contacted Pt and LVM stating that per Dr. Cannon Kettle she went ahead and sent and abx Rx to pharmacy and to resume dressing wound with betaine or iodosorb and if no better call the [place] office to see the Dr. Gerline Legacy

## 2019-12-18 NOTE — Telephone Encounter (Signed)
Will you let patient know I sent antibiotics to her pharmacy. To resume dressing wound with betadine or Iodosorb. If symptoms are not improving she needs to call [facility] office for a follow up appt Thanks Dr Chauncey Cruel

## 2019-12-18 NOTE — Telephone Encounter (Signed)
Patient states wound opened back up. Foot is swollen and very, very red.  Has had chills several nights. Nurse from [place] told her to call our office. She has been following prior wound care instructions.

## 2019-12-18 NOTE — Progress Notes (Signed)
Sent bactrim for concern for foot infection

## 2019-12-19 NOTE — Telephone Encounter (Signed)
Contacted and spoke with pt about new Rx sent to her pharmacy. Pt was also told to resume dressing wound with betaine and iodosorb and if no better call the office to see her sooner

## 2020-01-11 ENCOUNTER — Encounter: Payer: Self-pay | Admitting: Sports Medicine

## 2020-01-11 ENCOUNTER — Ambulatory Visit (INDEPENDENT_AMBULATORY_CARE_PROVIDER_SITE_OTHER): Payer: Medicare PPO | Admitting: Sports Medicine

## 2020-01-11 ENCOUNTER — Other Ambulatory Visit: Payer: Self-pay

## 2020-01-11 DIAGNOSIS — M2040 Other hammer toe(s) (acquired), unspecified foot: Secondary | ICD-10-CM

## 2020-01-11 DIAGNOSIS — L97521 Non-pressure chronic ulcer of other part of left foot limited to breakdown of skin: Secondary | ICD-10-CM | POA: Diagnosis not present

## 2020-01-11 DIAGNOSIS — E1140 Type 2 diabetes mellitus with diabetic neuropathy, unspecified: Secondary | ICD-10-CM

## 2020-01-11 NOTE — Progress Notes (Signed)
Subjective: Shannon [name] is a 79 y.o. female patient seen in office for follow up evaluation of ulceration of the left 1st toe.  Patient reports that she has been doing her dressing applying Iodosorb with improvement, and reports that previous antibiotic of Bactrim has helped still has a little bit of redness in the morning at the great toe but otherwise is doing well, denies any nausea vomiting fever chills or any other constitutional symptoms at this time.  Patient has no other pedal complaints at this time.  Patient Active Problem List   Diagnosis Date Noted  . Depression 10/15/2016   Current Outpatient Medications on File Prior to Visit  Medication Sig Dispense Refill  . acetaminophen-codeine (TYLENOL #3) 300-30 MG tablet Take 1 tablet by mouth 2 (two) times daily as needed.    Marland Kitchen aspirin EC 81 MG tablet Take 81 mg by mouth daily.    Marland Kitchen azithromycin (ZITHROMAX) 250 MG tablet Take 250 mg by mouth as directed.    . butalbital-acetaminophen-caffeine (FIORICET WITH CODEINE) 50-325-40-30 MG capsule Take 1 capsule by mouth every 4 (four) hours as needed for headache.    . carbamide peroxide (DEBROX) 6.5 % OTIC solution 5 drops 2 (two) times daily.    . celecoxib (CELEBREX) 200 MG capsule     . cetirizine (ZYRTEC) 10 MG tablet Take 10 mg by mouth daily.    . citalopram (CELEXA) 20 MG tablet Take 20 mg by mouth daily.    . clotrimazole (LOTRIMIN) 1 % cream Apply 1 application topically 2 (two) times daily.    Marland Kitchen donepezil (ARICEPT) 5 MG tablet Take 5 mg by mouth daily.    Marland Kitchen EPINEPHrine (ADRENALIN) 0.1 % nasal solution Place 1 drop into the nose once.    Marland Kitchen EPINEPHrine 0.3 mg/0.3 mL IJ SOAJ injection SMARTSIG:0.3 Milliliter(s) IM Once PRN    . fluticasone (FLONASE) 50 MCG/ACT nasal spray     . furosemide (LASIX) 20 MG tablet Take 20 mg by mouth.    . gabapentin (NEURONTIN) 600 MG tablet Take 600 mg by mouth 3 (three) times daily.    Marland Kitchen HYDROcodone-acetaminophen (NORCO/VICODIN) 5-325 MG tablet Take  1 tablet by mouth every 6 (six) hours as needed for moderate pain.    . INVOKANA 300 MG TABS     . LANTUS SOLOSTAR 100 UNIT/ML Solostar Pen     . lidocaine (LIDODERM) 5 %     . LORazepam (ATIVAN) 1 MG tablet Take 1 mg by mouth every 8 (eight) hours.    Marland Kitchen losartan (COZAAR) 50 MG tablet     . metFORMIN (GLUCOPHAGE) 500 MG tablet     . MYRBETRIQ 25 MG TB24 tablet 50 mg.     . NOVOFINE 32G X 6 MM MISC     . nystatin (MYCOSTATIN/NYSTOP) 100000 UNIT/GM POWD     . omeprazole (PRILOSEC) 20 MG capsule     . ONGLYZA 5 MG TABS tablet     . Polyethyl Glycol-Propyl Glycol (SYSTANE) 0.4-0.3 % GEL ophthalmic gel Place 1 application into both eyes.    . potassium chloride SA (KLOR-CON) 20 MEQ tablet Take 20 mEq by mouth daily.    . Prodigy Lancets 28G MISC     . Skin Protectants, Misc. (MINERIN) CREA Apply topically.    . sulfamethoxazole-trimethoprim (BACTRIM) 400-80 MG tablet Take 1 tablet by mouth 2 (two) times daily. 28 tablet 0  . traMADol (ULTRAM-ER) 200 MG 24 hr tablet     . traZODone (DESYREL) 100 MG tablet Take 100  mg by mouth at bedtime.    . triamcinolone cream (KENALOG) 0.1 % Apply 1 application topically 2 (two) times daily.    . Wound Dressings (MEDIHONEY WOUND/BURN DRESSING) GEL Apply to left toe ulceration 3x per week 15 mL 1   No current facility-administered medications on file prior to visit.   Allergies  Allergen Reactions  . Peanut Oil Anaphylaxis  . Demerol  [Meperidine Hcl] Nausea And Vomiting  . Other Hives    Takes skin off  . Demerol [Meperidine]   . Peanut Butter Flavor   . Tape Dermatitis    No results found for this or any previous visit (from the past 2160 hour(s)).  Objective: There were no vitals filed for this visit.  General: Patient is awake, alert, oriented x 3 and in no acute distress.  Dermatology: Skin is warm and dry bilateral with a partial thickness ulceration present left 1st toe. Ulceration measures distal tuft 0. 0.3 x 0.4 x 0.1 cm with a  granular base. The ulceration does not probe to bone. There is no malodor, no active drainage, blanchable erythema consistent with history of swelling no other acute signs of infection.  Nails short and thick bilateral.   Vascular: Dorsalis Pedis pulse = 1/4 Bilateral,  Posterior Tibial pulse = 1/4 Bilateral,  Capillary Fill Time < 5 seconds  Neurologic: Protective sensation intact diminished to the level of midfoot using the 5.07/10g [employer].  Musculosketal: There is no pain with palpation to ulcerated area to left 1st toe. No pain with compression to calves bilateral. + Hammertoe/digital bony deformities noted bilateral.  Pes planus foot type bilateral.  No results for input(s): GRAMSTAIN, LABORGA in the last 8760 hours.  Assessment and Plan:  Problem List Items Addressed This Visit    None    Visit Diagnoses    Toe ulcer, left, limited to breakdown of skin ([place])    -  Primary   Type 2 diabetes, controlled, with neuropathy ([place])       Hammer toe, unspecified laterality         -Examined patient  -Re-discussed the progression of the wound and treatment alternatives. -Mechanically debrided ulceration at distal tuft of left great toe to healthy bleeding borders removing all nonviable tissue.  Hemostasis was achieved with manual pressure with minimal blood loss noted.  Wounds were measured as above post debridement.  Patient tolerated the debridement procedure well without need for anesthesia. -Applied Betadine and iodosorb and dry sterile dressing and instructed patient to continue with dressings every day with post op shoe -Bactrim completed advised patient to closely monitor redness as long as it blanches is normal - Advised patient to go to the ER or return to office if the wound worsens or if constitutional symptoms are present. -Patient to return to office in 3 weeks for follow up wound care or sooner if problems arise.  Landis Martins, DPM

## 2020-02-01 ENCOUNTER — Other Ambulatory Visit: Payer: Self-pay

## 2020-02-01 ENCOUNTER — Encounter: Payer: Self-pay | Admitting: Sports Medicine

## 2020-02-01 ENCOUNTER — Ambulatory Visit (INDEPENDENT_AMBULATORY_CARE_PROVIDER_SITE_OTHER): Payer: Medicare PPO | Admitting: Sports Medicine

## 2020-02-01 DIAGNOSIS — L97521 Non-pressure chronic ulcer of other part of left foot limited to breakdown of skin: Secondary | ICD-10-CM | POA: Diagnosis not present

## 2020-02-01 DIAGNOSIS — E1140 Type 2 diabetes mellitus with diabetic neuropathy, unspecified: Secondary | ICD-10-CM | POA: Diagnosis not present

## 2020-02-01 DIAGNOSIS — M2040 Other hammer toe(s) (acquired), unspecified foot: Secondary | ICD-10-CM

## 2020-02-01 NOTE — Progress Notes (Signed)
Subjective: Shannon [name] is a 79 y.o. female patient seen in office for follow up evaluation of ulceration of the left 1st toe.  Patient reports that he is doing good seem like it is healed but has still been dressing daily using Iodosorb and dry dressing without any issues and using postoperative shoe.  No other pedal complaints noted.  Patient Active Problem List   Diagnosis Date Noted  . Depression 10/15/2016   Current Outpatient Medications on File Prior to Visit  Medication Sig Dispense Refill  . acetaminophen-codeine (TYLENOL #3) 300-30 MG tablet Take 1 tablet by mouth 2 (two) times daily as needed.    Marland Kitchen aspirin EC 81 MG tablet Take 81 mg by mouth daily.    Marland Kitchen azithromycin (ZITHROMAX) 250 MG tablet Take 250 mg by mouth as directed.    . butalbital-acetaminophen-caffeine (FIORICET WITH CODEINE) 50-325-40-30 MG capsule Take 1 capsule by mouth every 4 (four) hours as needed for headache.    . carbamide peroxide (DEBROX) 6.5 % OTIC solution 5 drops 2 (two) times daily.    . celecoxib (CELEBREX) 200 MG capsule     . cetirizine (ZYRTEC) 10 MG tablet Take 10 mg by mouth daily.    . ciprofloxacin (CIPRO) 250 MG tablet SMARTSIG:1 Tablet(s) By Mouth Every 12 Hours    . citalopram (CELEXA) 20 MG tablet Take 20 mg by mouth daily.    . clotrimazole (LOTRIMIN) 1 % cream Apply 1 application topically 2 (two) times daily.    Marland Kitchen donepezil (ARICEPT) 5 MG tablet Take 5 mg by mouth daily.    Marland Kitchen EPINEPHrine (ADRENALIN) 0.1 % nasal solution Place 1 drop into the nose once.    Marland Kitchen EPINEPHrine 0.3 mg/0.3 mL IJ SOAJ injection SMARTSIG:0.3 Milliliter(s) IM Once PRN    . fluticasone (FLONASE) 50 MCG/ACT nasal spray     . furosemide (LASIX) 20 MG tablet Take 20 mg by mouth.    . gabapentin (NEURONTIN) 600 MG tablet Take 600 mg by mouth 3 (three) times daily.    Marland Kitchen HYDROcodone-acetaminophen (NORCO/VICODIN) 5-325 MG tablet Take 1 tablet by mouth every 6 (six) hours as needed for moderate pain.    . INVOKANA 300 MG  TABS     . LANTUS SOLOSTAR 100 UNIT/ML Solostar Pen     . lidocaine (LIDODERM) 5 %     . LORazepam (ATIVAN) 1 MG tablet Take 1 mg by mouth every 8 (eight) hours.    Marland Kitchen losartan (COZAAR) 50 MG tablet     . metFORMIN (GLUCOPHAGE) 500 MG tablet     . MYRBETRIQ 25 MG TB24 tablet 50 mg.     . NOVOFINE 32G X 6 MM MISC     . nystatin (MYCOSTATIN/NYSTOP) 100000 UNIT/GM POWD     . omeprazole (PRILOSEC) 20 MG capsule     . ONGLYZA 5 MG TABS tablet     . pioglitazone-metformin (ACTOPLUS MET) 15-500 MG tablet Take 1 tablet by mouth 2 (two) times daily.    Vladimir Faster Glycol-Propyl Glycol (SYSTANE) 0.4-0.3 % GEL ophthalmic gel Place 1 application into both eyes.    . potassium chloride SA (KLOR-CON) 20 MEQ tablet Take 20 mEq by mouth daily.    . Prodigy Lancets 28G MISC     . Skin Protectants, Misc. (MINERIN) CREA Apply topically.    . sulfamethoxazole-trimethoprim (BACTRIM) 400-80 MG tablet Take 1 tablet by mouth 2 (two) times daily. 28 tablet 0  . traMADol (ULTRAM) 50 MG tablet Take 50 mg by mouth 2 (two) times  daily as needed.    . traMADol (ULTRAM-ER) 200 MG 24 hr tablet     . traZODone (DESYREL) 100 MG tablet Take 100 mg by mouth at bedtime.    . triamcinolone cream (KENALOG) 0.1 % Apply 1 application topically 2 (two) times daily.    . Wound Dressings (MEDIHONEY WOUND/BURN DRESSING) GEL Apply to left toe ulceration 3x per week 15 mL 1   No current facility-administered medications on file prior to visit.   Allergies  Allergen Reactions  . Peanut Oil Anaphylaxis  . Demerol  [Meperidine Hcl] Nausea And Vomiting  . Other Hives    Takes skin off  . Demerol [Meperidine]   . Peanut Butter Flavor   . Tape Dermatitis    No results found for this or any previous visit (from the past 2160 hour(s)).  Objective: There were no vitals filed for this visit.  General: Patient is awake, alert, oriented x 3 and in no acute distress.  Dermatology: Skin is warm and dry bilateral with a prematurely  healed ulceration noted to the distal tuft of the left first toe. There is no malodor, no active drainage, blanchable erythema consistent with history of swelling no other acute signs of infection.  Nails short and thick bilateral.   Vascular: Dorsalis Pedis pulse = 1/4 Bilateral,  Posterior Tibial pulse = 1/4 Bilateral,  Capillary Fill Time < 5 seconds  Neurologic: Protective sensation intact diminished to the level of midfoot using the 5.07/10g [employer].  Musculosketal: There is no pain with palpation to left first toe.  No pain with compression to calves bilateral. + Hammertoe/digital bony deformities noted bilateral.  Pes planus foot type bilateral.  No results for input(s): GRAMSTAIN, LABORGA in the last 8760 hours.  Assessment and Plan:  Problem List Items Addressed This Visit    None    Visit Diagnoses    Toe ulcer, left, limited to breakdown of skin ([place])    -  Primary   prematurely healed    Type 2 diabetes, controlled, with neuropathy ([place])       Hammer toe, unspecified laterality          -Examined patient  -Re-discussed the progression of the wound and treatment alternatives. -Mechanically debrided skin at distal tuft of left great toe using a 15 blade with no underlying opening noted.  Applied protective dry gauze dressing and advised patient to do this time daily for the next week after 1 week if there is no drainage on the gauze may discontinue dressings and wear a sock with her postoperative shoe -Advised patient to bring with her next visit her normal shoe for me to reassess to see if she can wear -Patient to return to office in 3 weeks for follow up wound care or sooner if problems arise.  Landis Martins, DPM

## 2020-02-22 ENCOUNTER — Ambulatory Visit (INDEPENDENT_AMBULATORY_CARE_PROVIDER_SITE_OTHER): Payer: Medicare PPO | Admitting: Sports Medicine

## 2020-02-22 ENCOUNTER — Encounter: Payer: Self-pay | Admitting: Sports Medicine

## 2020-02-22 ENCOUNTER — Other Ambulatory Visit: Payer: Self-pay

## 2020-02-22 DIAGNOSIS — E1140 Type 2 diabetes mellitus with diabetic neuropathy, unspecified: Secondary | ICD-10-CM

## 2020-02-22 DIAGNOSIS — M79674 Pain in right toe(s): Secondary | ICD-10-CM | POA: Diagnosis not present

## 2020-02-22 DIAGNOSIS — L97521 Non-pressure chronic ulcer of other part of left foot limited to breakdown of skin: Secondary | ICD-10-CM

## 2020-02-22 DIAGNOSIS — B351 Tinea unguium: Secondary | ICD-10-CM | POA: Diagnosis not present

## 2020-02-22 DIAGNOSIS — M79675 Pain in left toe(s): Secondary | ICD-10-CM | POA: Diagnosis not present

## 2020-02-22 NOTE — Progress Notes (Signed)
Subjective: Shannon [name] is a 79 y.o. female patient seen in office for follow up evaluation of ulceration of the left 1st toe.  Patient reports that he is doing good seem like it is healed but has still been dressing daily using dry dressing and then transition to sock with post op shoe with no issues.  No other pedal complaints noted.  Patient Active Problem List   Diagnosis Date Noted  . Depression 10/15/2016   Current Outpatient Medications on File Prior to Visit  Medication Sig Dispense Refill  . acetaminophen-codeine (TYLENOL #3) 300-30 MG tablet Take 1 tablet by mouth 2 (two) times daily as needed.    Marland Kitchen aspirin EC 81 MG tablet Take 81 mg by mouth daily.    Marland Kitchen azithromycin (ZITHROMAX) 250 MG tablet Take 250 mg by mouth as directed.    . butalbital-acetaminophen-caffeine (FIORICET WITH CODEINE) 50-325-40-30 MG capsule Take 1 capsule by mouth every 4 (four) hours as needed for headache.    . carbamide peroxide (DEBROX) 6.5 % OTIC solution 5 drops 2 (two) times daily.    . celecoxib (CELEBREX) 200 MG capsule     . cetirizine (ZYRTEC) 10 MG tablet Take 10 mg by mouth daily.    . ciprofloxacin (CIPRO) 250 MG tablet SMARTSIG:1 Tablet(s) By Mouth Every 12 Hours    . citalopram (CELEXA) 20 MG tablet Take 20 mg by mouth daily.    . clotrimazole (LOTRIMIN) 1 % cream Apply 1 application topically 2 (two) times daily.    Marland Kitchen donepezil (ARICEPT) 5 MG tablet Take 5 mg by mouth daily.    Marland Kitchen EPINEPHrine (ADRENALIN) 0.1 % nasal solution Place 1 drop into the nose once.    Marland Kitchen EPINEPHrine 0.3 mg/0.3 mL IJ SOAJ injection SMARTSIG:0.3 Milliliter(s) IM Once PRN    . fluticasone (FLONASE) 50 MCG/ACT nasal spray     . furosemide (LASIX) 20 MG tablet Take 20 mg by mouth.    . gabapentin (NEURONTIN) 600 MG tablet Take 600 mg by mouth 3 (three) times daily.    Marland Kitchen HYDROcodone-acetaminophen (NORCO/VICODIN) 5-325 MG tablet Take 1 tablet by mouth every 6 (six) hours as needed for moderate pain.    . INVOKANA 300 MG  TABS     . LANTUS SOLOSTAR 100 UNIT/ML Solostar Pen     . lidocaine (LIDODERM) 5 %     . LORazepam (ATIVAN) 1 MG tablet Take 1 mg by mouth every 8 (eight) hours.    Marland Kitchen losartan (COZAAR) 50 MG tablet     . metFORMIN (GLUCOPHAGE) 500 MG tablet     . MYRBETRIQ 25 MG TB24 tablet 50 mg.     . NOVOFINE 32G X 6 MM MISC     . nystatin (MYCOSTATIN/NYSTOP) 100000 UNIT/GM POWD     . omeprazole (PRILOSEC) 20 MG capsule     . ONGLYZA 5 MG TABS tablet     . pioglitazone-metformin (ACTOPLUS MET) 15-500 MG tablet Take 1 tablet by mouth 2 (two) times daily.    Vladimir Faster Glycol-Propyl Glycol (SYSTANE) 0.4-0.3 % GEL ophthalmic gel Place 1 application into both eyes.    . potassium chloride SA (KLOR-CON) 20 MEQ tablet Take 20 mEq by mouth daily.    . Prodigy Lancets 28G MISC     . Skin Protectants, Misc. (MINERIN) CREA Apply topically.    . sulfamethoxazole-trimethoprim (BACTRIM) 400-80 MG tablet Take 1 tablet by mouth 2 (two) times daily. 28 tablet 0  . traMADol (ULTRAM) 50 MG tablet Take 50 mg by mouth  2 (two) times daily as needed.    . traMADol (ULTRAM-ER) 200 MG 24 hr tablet     . traZODone (DESYREL) 100 MG tablet Take 100 mg by mouth at bedtime.    . triamcinolone cream (KENALOG) 0.1 % Apply 1 application topically 2 (two) times daily.    . Wound Dressings (MEDIHONEY WOUND/BURN DRESSING) GEL Apply to left toe ulceration 3x per week 15 mL 1   No current facility-administered medications on file prior to visit.   Allergies  Allergen Reactions  . Peanut Oil Anaphylaxis  . Demerol  [Meperidine Hcl] Nausea And Vomiting  . Other Hives    Takes skin off  . Demerol [Meperidine]   . Peanut Butter Flavor   . Tape Dermatitis    No results found for this or any previous visit (from the past 2160 hour(s)).  Objective: There were no vitals filed for this visit.  General: Patient is awake, alert, oriented x 3 and in no acute distress.  Dermatology: Skin is warm and dry bilateral with a healed  ulceration noted to the distal tuft of the left first toe. There is no malodor, no active drainage, blanchable erythema consistent with history of swelling no other acute signs of infection.  Nails are elongated and thick bilateral.   Vascular: Dorsalis Pedis pulse = 1/4 Bilateral,  Posterior Tibial pulse = 1/4 Bilateral,  Capillary Fill Time < 5 seconds  Neurologic: Protective sensation intact diminished to the level of midfoot using the 5.07/10g [employer].  Musculosketal: There is no pain with palpation to left first toe.  No pain with compression to calves bilateral. + Hammertoe/digital bony deformities noted bilateral.  Pes planus foot type bilateral.  No results for input(s): GRAMSTAIN, LABORGA in the last 8760 hours.  Assessment and Plan:  Problem List Items Addressed This Visit    None    Visit Diagnoses    Toe ulcer, left, limited to breakdown of skin ([facility])    -  Primary   Healed   Type 2 diabetes, controlled, with neuropathy ([facility])       Pain due to onychomycosis of toenails of both feet       Relevant Medications   nitrofurantoin, macrocrystal-monohydrate, (MACROBID) 100 MG capsule     -Examined patient  -Re-discussed the progression of the healed wound -May transition to normal shoe with padding as applied -Debrided nails x 10 using sterile nail nipper without incident  -Patient to return to office in 6 weeks for follow up wound check or sooner if problems arise.  Landis Martins, DPM

## 2020-04-04 ENCOUNTER — Ambulatory Visit: Payer: Medicare PPO | Admitting: Sports Medicine

## 2020-04-04 ENCOUNTER — Encounter: Payer: Self-pay | Admitting: Sports Medicine

## 2020-04-04 ENCOUNTER — Other Ambulatory Visit: Payer: Self-pay

## 2020-04-04 ENCOUNTER — Other Ambulatory Visit: Payer: Self-pay | Admitting: Nurse Practitioner

## 2020-04-04 ENCOUNTER — Ambulatory Visit (INDEPENDENT_AMBULATORY_CARE_PROVIDER_SITE_OTHER): Payer: Medicare PPO | Admitting: Sports Medicine

## 2020-04-04 DIAGNOSIS — M810 Age-related osteoporosis without current pathological fracture: Secondary | ICD-10-CM

## 2020-04-04 DIAGNOSIS — L97521 Non-pressure chronic ulcer of other part of left foot limited to breakdown of skin: Secondary | ICD-10-CM

## 2020-04-04 DIAGNOSIS — E1140 Type 2 diabetes mellitus with diabetic neuropathy, unspecified: Secondary | ICD-10-CM | POA: Diagnosis not present

## 2020-04-04 NOTE — Progress Notes (Signed)
Subjective: Shannon [name] is a 79 y.o. female patient seen in office for follow up evaluation of ulceration of the left 1st toe.  Patient reports that she is doing good with no issues.  No other pedal complaints noted.  Patient Active Problem List   Diagnosis Date Noted  . Depression 10/15/2016   Current Outpatient Medications on File Prior to Visit  Medication Sig Dispense Refill  . acetaminophen-codeine (TYLENOL #3) 300-30 MG tablet Take 1 tablet by mouth 2 (two) times daily as needed.    Marland Kitchen aspirin EC 81 MG tablet Take 81 mg by mouth daily.    Marland Kitchen azithromycin (ZITHROMAX) 250 MG tablet Take 250 mg by mouth as directed.    . butalbital-acetaminophen-caffeine (FIORICET WITH CODEINE) 50-325-40-30 MG capsule Take 1 capsule by mouth every 4 (four) hours as needed for headache.    . carbamide peroxide (DEBROX) 6.5 % OTIC solution 5 drops 2 (two) times daily.    . celecoxib (CELEBREX) 200 MG capsule     . cetirizine (ZYRTEC) 10 MG tablet Take 10 mg by mouth daily.    . ciprofloxacin (CIPRO) 250 MG tablet SMARTSIG:1 Tablet(s) By Mouth Every 12 Hours    . citalopram (CELEXA) 20 MG tablet Take 20 mg by mouth daily.    . clotrimazole (LOTRIMIN) 1 % cream Apply 1 application topically 2 (two) times daily.    Marland Kitchen donepezil (ARICEPT) 5 MG tablet Take 5 mg by mouth daily.    Marland Kitchen EPINEPHrine (ADRENALIN) 0.1 % nasal solution Place 1 drop into the nose once.    Marland Kitchen EPINEPHrine 0.3 mg/0.3 mL IJ SOAJ injection SMARTSIG:0.3 Milliliter(s) IM Once PRN    . fluticasone (FLONASE) 50 MCG/ACT nasal spray     . furosemide (LASIX) 20 MG tablet Take 20 mg by mouth.    . gabapentin (NEURONTIN) 600 MG tablet Take 600 mg by mouth 3 (three) times daily.    Marland Kitchen HYDROcodone-acetaminophen (NORCO/VICODIN) 5-325 MG tablet Take 1 tablet by mouth every 6 (six) hours as needed for moderate pain.    . INVOKANA 300 MG TABS     . LANTUS SOLOSTAR 100 UNIT/ML Solostar Pen     . lidocaine (LIDODERM) 5 %     . LORazepam (ATIVAN) 1 MG tablet  Take 1 mg by mouth every 8 (eight) hours.    Marland Kitchen losartan (COZAAR) 50 MG tablet     . metFORMIN (GLUCOPHAGE) 500 MG tablet     . MYRBETRIQ 25 MG TB24 tablet 50 mg.     . nitrofurantoin, macrocrystal-monohydrate, (MACROBID) 100 MG capsule Take 100 mg by mouth 2 (two) times daily.    Marland Kitchen NOVOFINE 32G X 6 MM MISC     . nystatin (MYCOSTATIN/NYSTOP) 100000 UNIT/GM POWD     . omeprazole (PRILOSEC) 20 MG capsule     . ONGLYZA 5 MG TABS tablet     . pioglitazone-metformin (ACTOPLUS MET) 15-500 MG tablet Take 1 tablet by mouth 2 (two) times daily.    Vladimir Faster Glycol-Propyl Glycol (SYSTANE) 0.4-0.3 % GEL ophthalmic gel Place 1 application into both eyes.    . potassium chloride SA (KLOR-CON) 20 MEQ tablet Take 20 mEq by mouth daily.    . Prodigy Lancets 28G MISC     . Skin Protectants, Misc. (MINERIN) CREA Apply topically.    . sulfamethoxazole-trimethoprim (BACTRIM) 400-80 MG tablet Take 1 tablet by mouth 2 (two) times daily. 28 tablet 0  . traMADol (ULTRAM) 50 MG tablet Take 50 mg by mouth 2 (two) times daily  as needed.    . traMADol (ULTRAM-ER) 200 MG 24 hr tablet     . traZODone (DESYREL) 100 MG tablet Take 100 mg by mouth at bedtime.    . triamcinolone cream (KENALOG) 0.1 % Apply 1 application topically 2 (two) times daily.    . Wound Dressings (MEDIHONEY WOUND/BURN DRESSING) GEL Apply to left toe ulceration 3x per week 15 mL 1   No current facility-administered medications on file prior to visit.   Allergies  Allergen Reactions  . Peanut Oil Anaphylaxis  . Demerol  [Meperidine Hcl] Nausea And Vomiting  . Other Hives    Takes skin off  . Demerol [Meperidine]   . Peanut Butter Flavor   . Tape Dermatitis    No results found for this or any previous visit (from the past 2160 hour(s)).  Objective: There were no vitals filed for this visit.  General: Patient is awake, alert, oriented x 3 and in no acute distress.  Dermatology: Skin is warm and dry bilateral with a continued healed  ulceration noted to the distal tuft of the left first toe. There is no malodor, no active drainage, blanchable erythema consistent with history of swelling no other acute signs of infection.  Nails are short and thick bilateral.   Vascular: Dorsalis Pedis pulse = 1/4 Bilateral,  Posterior Tibial pulse = 1/4 Bilateral,  Capillary Fill Time < 5 seconds  Neurologic: Protective sensation intact diminished to the level of midfoot using the 5.07/10g [employer].  Musculosketal: There is no pain with palpation to left first toe.  No pain with compression to calves bilateral. + Hammertoe/digital bony deformities noted bilateral.  Pes planus foot type bilateral.  No results for input(s): GRAMSTAIN, LABORGA in the last 8760 hours.  Assessment and Plan:  Problem List Items Addressed This Visit   None   Visit Diagnoses    Toe ulcer, left, limited to breakdown of skin ([place])    -  Primary   healed   Type 2 diabetes, controlled, with neuropathy ([place])         -Examined patient  -Re-discussed the progression of the healed wound -Continue with normal shoes -Patient to return to office in 9-10 weeks for follow up wound check and for new diabetic shoe measurements or sooner if problems arise.  Landis Martins, DPM

## 2020-06-06 ENCOUNTER — Other Ambulatory Visit: Payer: Self-pay

## 2020-06-06 ENCOUNTER — Encounter: Payer: Self-pay | Admitting: Sports Medicine

## 2020-06-06 ENCOUNTER — Ambulatory Visit: Payer: Medicare PPO | Admitting: Orthotics

## 2020-06-06 ENCOUNTER — Ambulatory Visit (INDEPENDENT_AMBULATORY_CARE_PROVIDER_SITE_OTHER): Payer: Medicare PPO | Admitting: Sports Medicine

## 2020-06-06 DIAGNOSIS — M79674 Pain in right toe(s): Secondary | ICD-10-CM

## 2020-06-06 DIAGNOSIS — M2040 Other hammer toe(s) (acquired), unspecified foot: Secondary | ICD-10-CM

## 2020-06-06 DIAGNOSIS — B351 Tinea unguium: Secondary | ICD-10-CM | POA: Diagnosis not present

## 2020-06-06 DIAGNOSIS — E1140 Type 2 diabetes mellitus with diabetic neuropathy, unspecified: Secondary | ICD-10-CM

## 2020-06-06 DIAGNOSIS — M79675 Pain in left toe(s): Secondary | ICD-10-CM

## 2020-06-06 NOTE — Progress Notes (Signed)
Patient ID: Shannon [name], female   DOB: December 22, 1940, 80 y.o.   MRN: [id number] Subjective: Shannon [name] is a 80 y.o. female patient with history of type 2 diabetes who returns to office today complaining of long, painful nails while ambulating in shoes; unable to trim. Patient states that the glucose readings have been good and states that she brought shoes for me to inspect.  Patient denies any new changes in medication or new problems.   Patient lives at [facility].  Patient Active Problem List   Diagnosis Date Noted  . Depression 10/15/2016   Current Outpatient Medications on File Prior to Visit  Medication Sig Dispense Refill  . acetaminophen-codeine (TYLENOL #3) 300-30 MG tablet Take 1 tablet by mouth 2 (two) times daily as needed.    Marland Kitchen aspirin EC 81 MG tablet Take 81 mg by mouth daily.    Marland Kitchen azithromycin (ZITHROMAX) 250 MG tablet Take 250 mg by mouth as directed.    . butalbital-acetaminophen-caffeine (FIORICET WITH CODEINE) 50-325-40-30 MG capsule Take 1 capsule by mouth every 4 (four) hours as needed for headache.    . carbamide peroxide (DEBROX) 6.5 % OTIC solution 5 drops 2 (two) times daily.    . celecoxib (CELEBREX) 200 MG capsule     . cetirizine (ZYRTEC) 10 MG tablet Take 10 mg by mouth daily.    . ciprofloxacin (CIPRO) 250 MG tablet SMARTSIG:1 Tablet(s) By Mouth Every 12 Hours    . citalopram (CELEXA) 20 MG tablet Take 20 mg by mouth daily.    . clotrimazole (LOTRIMIN) 1 % cream Apply 1 application topically 2 (two) times daily.    Marland Kitchen donepezil (ARICEPT) 5 MG tablet Take 5 mg by mouth daily.    Marland Kitchen EPINEPHrine (ADRENALIN) 0.1 % nasal solution Place 1 drop into the nose once.    Marland Kitchen EPINEPHrine 0.3 mg/0.3 mL IJ SOAJ injection SMARTSIG:0.3 Milliliter(s) IM Once PRN    . fluticasone (FLONASE) 50 MCG/ACT nasal spray     . furosemide (LASIX) 20 MG tablet Take 20 mg by mouth.    . gabapentin (NEURONTIN) 600 MG tablet Take 600 mg by mouth 3 (three) times  daily.    Marland Kitchen HYDROcodone-acetaminophen (NORCO/VICODIN) 5-325 MG tablet Take 1 tablet by mouth every 6 (six) hours as needed for moderate pain.    . INVOKANA 300 MG TABS     . LANTUS SOLOSTAR 100 UNIT/ML Solostar Pen     . lidocaine (LIDODERM) 5 %     . LORazepam (ATIVAN) 1 MG tablet Take 1 mg by mouth every 8 (eight) hours.    Marland Kitchen losartan (COZAAR) 50 MG tablet     . metFORMIN (GLUCOPHAGE) 500 MG tablet     . MYRBETRIQ 25 MG TB24 tablet 50 mg.     . nitrofurantoin, macrocrystal-monohydrate, (MACROBID) 100 MG capsule Take 100 mg by mouth 2 (two) times daily.    Marland Kitchen NOVOFINE 32G X 6 MM MISC     . nystatin (MYCOSTATIN/NYSTOP) 100000 UNIT/GM POWD     . omeprazole (PRILOSEC) 20 MG capsule     . ONGLYZA 5 MG TABS tablet     . pioglitazone-metformin (ACTOPLUS MET) 15-500 MG tablet Take 1 tablet by mouth 2 (two) times daily.    Vladimir Faster Glycol-Propyl Glycol (SYSTANE) 0.4-0.3 % GEL ophthalmic gel Place 1 application into both eyes.    . potassium chloride SA (KLOR-CON) 20 MEQ tablet Take 20 mEq by mouth daily.    . Prodigy Lancets 28G MISC     .  Skin Protectants, Misc. (MINERIN) CREA Apply topically.    . sulfamethoxazole-trimethoprim (BACTRIM) 400-80 MG tablet Take 1 tablet by mouth 2 (two) times daily. 28 tablet 0  . traMADol (ULTRAM) 50 MG tablet Take 50 mg by mouth 2 (two) times daily as needed.    . traMADol (ULTRAM-ER) 200 MG 24 hr tablet     . traZODone (DESYREL) 100 MG tablet Take 100 mg by mouth at bedtime.    . triamcinolone cream (KENALOG) 0.1 % Apply 1 application topically 2 (two) times daily.    . Wound Dressings (MEDIHONEY WOUND/BURN DRESSING) GEL Apply to left toe ulceration 3x per week 15 mL 1   No current facility-administered medications on file prior to visit.   Allergies  Allergen Reactions  . Peanut Oil Anaphylaxis  . Demerol  [Meperidine Hcl] Nausea And Vomiting  . Other Hives    Takes skin off  . Demerol [Meperidine]   . Peanut Butter Flavor   . Tape Dermatitis    Objective: General: Patient is awake, alert, and oriented x 3 and in no acute distress.  Integument: Skin is warm, dry and supple bilateral. Nails are tender, long, thickened and dystrophic with subungual debris, consistent with onychomycosis, 1-5 bilateral. + Very minimal corn at right 3rd toe. No open lesions present bilateral. Old surgical scars Remaining integument unremarkable.  Vasculature:  Dorsalis Pedis pulse1/4 bilateral. Posterior Tibial pulse 1 /4 bilateral.  Capillary fill time <4 sec 1-5 bilateral. Scant hair growth to the level of the digits. Temperature gradient within normal limits.  + varicosities present bilateral. No edema present bilateral.   Neurology: The patient has absent sensation measured with a 5.07/10g Semmes Weinstein Monofilament at all pedal sites bilateral . Vibratory sensation diminished bilateral with tuning fork. No Babinski sign present bilateral.   Musculoskeletal: Bunion with crossover and hammertoes deformities noted bilateral, midfoot exostosis on right with bilateral pes planus. Muscular strength 5/5 in all lower extremity muscular groups bilateral without pain or limitation on range of motion . No tenderness with calf compression bilateral.  Assessment and Plan: Problem List Items Addressed This Visit   None   Visit Diagnoses    Pain due to onychomycosis of toenails of both feet    -  Primary   Type 2 diabetes, controlled, with neuropathy (HCC)       Hammer toe, unspecified laterality         -Examined patient. -Discussed and educated patient on diabetic foot care, especially with  regards to the vascular, neurological and musculoskeletal systems.  -Mechanically debrided all nails 1-5 bilateral using sterile nail nipper and filed with dremel without incident. -Advised good supportive shoes -Answered all patient questions -Patient to return in 2.5 months for at risk foot care -Patient advised to call the office if any problems or questions  arise in the meantime.  Landis Martins, DPM

## 2020-06-06 NOTE — Progress Notes (Signed)
Cast and p/out shoes; however holding order off until she advises who PA superisiong MD might be.

## 2020-07-04 ENCOUNTER — Ambulatory Visit (INDEPENDENT_AMBULATORY_CARE_PROVIDER_SITE_OTHER): Payer: Medicare PPO | Admitting: Sports Medicine

## 2020-07-04 ENCOUNTER — Encounter: Payer: Self-pay | Admitting: Sports Medicine

## 2020-07-04 ENCOUNTER — Other Ambulatory Visit: Payer: Self-pay

## 2020-07-04 ENCOUNTER — Ambulatory Visit (INDEPENDENT_AMBULATORY_CARE_PROVIDER_SITE_OTHER): Payer: Medicare PPO

## 2020-07-04 VITALS — Temp 98.0°F

## 2020-07-04 DIAGNOSIS — E559 Vitamin D deficiency, unspecified: Secondary | ICD-10-CM | POA: Insufficient documentation

## 2020-07-04 DIAGNOSIS — J449 Chronic obstructive pulmonary disease, unspecified: Secondary | ICD-10-CM | POA: Insufficient documentation

## 2020-07-04 DIAGNOSIS — M79671 Pain in right foot: Secondary | ICD-10-CM

## 2020-07-04 DIAGNOSIS — Z79899 Other long term (current) drug therapy: Secondary | ICD-10-CM | POA: Insufficient documentation

## 2020-07-04 DIAGNOSIS — I739 Peripheral vascular disease, unspecified: Secondary | ICD-10-CM | POA: Insufficient documentation

## 2020-07-04 DIAGNOSIS — E1142 Type 2 diabetes mellitus with diabetic polyneuropathy: Secondary | ICD-10-CM | POA: Insufficient documentation

## 2020-07-04 DIAGNOSIS — L97511 Non-pressure chronic ulcer of other part of right foot limited to breakdown of skin: Secondary | ICD-10-CM

## 2020-07-04 DIAGNOSIS — E782 Mixed hyperlipidemia: Secondary | ICD-10-CM | POA: Insufficient documentation

## 2020-07-04 DIAGNOSIS — M2040 Other hammer toe(s) (acquired), unspecified foot: Secondary | ICD-10-CM

## 2020-07-04 DIAGNOSIS — E039 Hypothyroidism, unspecified: Secondary | ICD-10-CM | POA: Insufficient documentation

## 2020-07-04 DIAGNOSIS — E1165 Type 2 diabetes mellitus with hyperglycemia: Secondary | ICD-10-CM | POA: Insufficient documentation

## 2020-07-04 DIAGNOSIS — E119 Type 2 diabetes mellitus without complications: Secondary | ICD-10-CM | POA: Insufficient documentation

## 2020-07-04 DIAGNOSIS — J0110 Acute frontal sinusitis, unspecified: Secondary | ICD-10-CM | POA: Insufficient documentation

## 2020-07-04 DIAGNOSIS — E1140 Type 2 diabetes mellitus with diabetic neuropathy, unspecified: Secondary | ICD-10-CM

## 2020-07-04 DIAGNOSIS — R3981 Functional urinary incontinence: Secondary | ICD-10-CM | POA: Insufficient documentation

## 2020-07-04 DIAGNOSIS — M150 Primary generalized (osteo)arthritis: Secondary | ICD-10-CM | POA: Insufficient documentation

## 2020-07-04 DIAGNOSIS — K589 Irritable bowel syndrome without diarrhea: Secondary | ICD-10-CM | POA: Insufficient documentation

## 2020-07-04 DIAGNOSIS — D518 Other vitamin B12 deficiency anemias: Secondary | ICD-10-CM | POA: Insufficient documentation

## 2020-07-04 DIAGNOSIS — I10 Essential (primary) hypertension: Secondary | ICD-10-CM | POA: Insufficient documentation

## 2020-07-04 MED ORDER — SULFAMETHOXAZOLE-TRIMETHOPRIM 400-80 MG PO TABS: 1.0000 | ORAL_TABLET | Freq: Two times a day (BID) | ORAL | 0 refills | Status: AC

## 2020-07-04 NOTE — Progress Notes (Signed)
Subjective: Shannon [name] is a 80 y.o. female patient seen in office for follow up evaluation of ulceration of the right 3rd toe. Patient reports that she noticed the toe swelling and bleeding 2 weeks ago and it started to get worse. Reports that she started applying iodosorb but the redness continued with some stinging pain.  No other pedal complaints noted.  Temp 98  Patient Active Problem List   Diagnosis Date Noted  . Acute frontal sinusitis 07/04/2020  . Chronic obstructive pulmonary disease ([place]) 07/04/2020  . 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 Essential hypertension 07/04/2020  . Functional urinary incontinence 07/04/2020  . Hyperglycemia due to type 2 diabetes mellitus ([place]) 07/04/2020  . Mixed hyperlipidemia 07/04/2020  . Hypothyroidism 07/04/2020  . Irritable bowel syndrome 07/04/2020  . Other long term (current) drug therapy 07/04/2020  . Other vitamin B12 deficiency anemias 07/04/2020  . Peripheral vascular disease ([place]) 07/04/2020  . Type 2 diabetes mellitus without complications ([place]) 16/96/7893  . Primary generalized (osteo)arthritis 07/04/2020  . Vitamin D deficiency 07/04/2020  . Depression 10/15/2016   Current Outpatient Medications on File Prior to Visit  Medication Sig Dispense Refill  . acetaminophen-codeine (TYLENOL #3) 300-30 MG tablet Take 1 tablet by mouth 2 (two) times daily as needed.    Marland Kitchen aspirin EC 81 MG tablet Take 81 mg by mouth daily.    Marland Kitchen azithromycin (ZITHROMAX) 250 MG tablet Take 250 mg by mouth as directed.    . Black Cohosh 40 MG CAPS 1 tablet in the morning    . butalbital-acetaminophen-caffeine (FIORICET WITH CODEINE) 50-325-40-30 MG capsule Take 1 capsule by mouth every 4 (four) hours as needed for headache.    . carbamide peroxide (DEBROX) 6.5 % OTIC solution 5 drops 2 (two) times daily.    . celecoxib (CELEBREX) 200 MG capsule     . cetirizine (ZYRTEC) 10 MG tablet Take 10 mg by mouth daily.    .  ciprofloxacin (CIPRO) 250 MG tablet SMARTSIG:1 Tablet(s) By Mouth Every 12 Hours    . citalopram (CELEXA) 20 MG tablet Take 20 mg by mouth daily.    . clotrimazole (LOTRIMIN) 1 % cream Apply 1 application topically 2 (two) times daily.    Marland Kitchen donepezil (ARICEPT) 5 MG tablet Take 5 mg by mouth daily.    Marland Kitchen EPINEPHrine (ADRENALIN) 0.1 % nasal solution Place 1 drop into the nose once.    Marland Kitchen EPINEPHrine 0.3 mg/0.3 mL IJ SOAJ injection SMARTSIG:0.3 Milliliter(s) IM Once PRN    . erythromycin ophthalmic ointment SMARTSIG:In Eye(s)    . fluticasone (FLONASE) 50 MCG/ACT nasal spray     . furosemide (LASIX) 20 MG tablet Take 20 mg by mouth.    . gabapentin (NEURONTIN) 600 MG tablet Take 600 mg by mouth 3 (three) times daily.    Marland Kitchen HYDROcodone-acetaminophen (NORCO/VICODIN) 5-325 MG tablet Take 1 tablet by mouth every 6 (six) hours as needed for moderate pain.    . INVOKANA 300 MG TABS     . LANTUS SOLOSTAR 100 UNIT/ML Solostar Pen     . lidocaine (LIDODERM) 5 %     . LORazepam (ATIVAN) 1 MG tablet Take 1 mg by mouth every 8 (eight) hours.    Marland Kitchen losartan (COZAAR) 50 MG tablet     . losartan-hydrochlorothiazide (HYZAAR) 100-25 MG tablet 1 tablet    . lubiprostone (AMITIZA) 24 MCG capsule 1 capsule with food    . metFORMIN (GLUCOPHAGE) 500 MG tablet     .  MYRBETRIQ 25 MG TB24 tablet 50 mg.     . nitrofurantoin, macrocrystal-monohydrate, (MACROBID) 100 MG capsule Take 100 mg by mouth 2 (two) times daily.    Marland Kitchen NOVOFINE 32G X 6 MM MISC     . nystatin (MYCOSTATIN/NYSTOP) 100000 UNIT/GM POWD     . omeprazole (PRILOSEC) 20 MG capsule     . ONGLYZA 5 MG TABS tablet     . pioglitazone-metformin (ACTOPLUS MET) 15-500 MG tablet Take 1 tablet by mouth 2 (two) times daily.    Vladimir Faster Glycol-Propyl Glycol (SYSTANE) 0.4-0.3 % GEL ophthalmic gel Place 1 application into both eyes.    . potassium chloride 20 MEQ/100ML IVPB 1 packet with food    . potassium chloride SA (KLOR-CON) 20 MEQ tablet Take 20 mEq by mouth  daily.    . Prodigy Lancets 28G MISC     . Skin Protectants, Misc. (MINERIN) CREA Apply topically.    . traMADol (ULTRAM) 50 MG tablet Take 50 mg by mouth 2 (two) times daily as needed.    . traMADol (ULTRAM-ER) 200 MG 24 hr tablet     . traZODone (DESYREL) 100 MG tablet Take 100 mg by mouth at bedtime.    . triamcinolone cream (KENALOG) 0.1 % Apply 1 application topically 2 (two) times daily.    . Wound Dressings (MEDIHONEY WOUND/BURN DRESSING) GEL Apply to left toe ulceration 3x per week 15 mL 1   No current facility-administered medications on file prior to visit.   Allergies  Allergen Reactions  . Peanut Oil Anaphylaxis  . Demerol  [Meperidine Hcl] Nausea And Vomiting  . Other Hives    Takes skin off  . Demerol [Meperidine]   . Peanut Butter Flavor   . Tape Dermatitis    No results found for this or any previous visit (from the past 2160 hour(s)).  Objective: There were no vitals filed for this visit.  General: Patient is awake, alert, oriented x 3 and in no acute distress.  Dermatology: Skin is warm and dry bilateral with a partial thickness ulcer at the distal tuft of the right 3rd toe meases <0.5cm with a granular base. There is no malodor, no active drainage, + erythema and warmth consistent with history of swelling no other acute signs of infection.  Nails are short and thick bilateral.   Vascular: Dorsalis Pedis pulse = 1/4 Bilateral,  Posterior Tibial pulse = 1/4 Bilateral,  Capillary Fill Time < 5 seconds  Neurologic: Protective sensation intact diminished to the level of midfoot using the 5.07/10g [employer].  Musculosketal: There is no pain with palpation to right third toe.  No pain with compression to calves bilateral. + Hammertoe/digital bony deformities noted bilateral.  Pes planus foot type bilateral.  No results for input(s): GRAMSTAIN, LABORGA in the last 8760 hours.  Assessment and Plan:  Problem List Items Addressed This Visit    None   Visit Diagnoses    Toe ulcer, right, limited to breakdown of skin (HCC)    -  Primary   Pain in right foot       Relevant Orders   DG Foot Complete Right   Hammer toe, unspecified laterality       Type 2 diabetes, controlled, with neuropathy (HCC)       Relevant Medications   losartan-hydrochlorothiazide (HYZAAR) 100-25 MG tablet     -Examined patient  -X-rays reviewed no acute osseous findings at the third toe however there is significant digital deformity as well as history of  foot fusion with a screw that is broken that is old -Discussed the progression of the new wound at the right third toe - Excisionally dedbrided ulceration at right third toe to healthy bleeding borders removing nonviable tissue using a sterile chisel blade. Wound measures post debridement ** cm. Wound was debrided to the level of the dermis with viable wound base exposed to promote healing. Hemostasis was achieved with manuel pressure. Patient tolerated procedure well without any discomfort or anesthesia necessary for this wound debridement.  -Applied Iodosorb and dry sterile dressing and instructed patient to continue with daily dressings at home consisting of the same -Prescribed Bactrim for patient to take as instructed -Advised patient if she is rubbing inside of her shoe to return to using her open surgical shoe - Advised patient to go to the ER or return to office if the wound worsens or if constitutional symptoms are present. -Return to office in 2 weeks for follow-up wound care  Landis Martins, DPM

## 2020-07-10 ENCOUNTER — Other Ambulatory Visit: Payer: Self-pay

## 2020-07-10 ENCOUNTER — Ambulatory Visit
Admission: RE | Admit: 2020-07-10 | Discharge: 2020-07-10 | Disposition: A | Discharge status: Home / Self Care | Payer: Medicare PPO | Source: Ambulatory Visit | Attending: Nurse Practitioner | Admitting: Nurse Practitioner

## 2020-07-10 ENCOUNTER — Other Ambulatory Visit: Payer: Self-pay | Admitting: Sports Medicine

## 2020-07-10 DIAGNOSIS — M810 Age-related osteoporosis without current pathological fracture: Secondary | ICD-10-CM

## 2020-07-10 DIAGNOSIS — L97511 Non-pressure chronic ulcer of other part of right foot limited to breakdown of skin: Secondary | ICD-10-CM

## 2020-07-18 ENCOUNTER — Telehealth: Payer: Self-pay | Admitting: Sports Medicine

## 2020-07-18 ENCOUNTER — Ambulatory Visit (INDEPENDENT_AMBULATORY_CARE_PROVIDER_SITE_OTHER): Payer: Medicare PPO | Admitting: Sports Medicine

## 2020-07-18 ENCOUNTER — Encounter: Payer: Self-pay | Admitting: Sports Medicine

## 2020-07-18 ENCOUNTER — Other Ambulatory Visit: Payer: Self-pay

## 2020-07-18 DIAGNOSIS — M79671 Pain in right foot: Secondary | ICD-10-CM

## 2020-07-18 DIAGNOSIS — L97511 Non-pressure chronic ulcer of other part of right foot limited to breakdown of skin: Secondary | ICD-10-CM | POA: Diagnosis not present

## 2020-07-18 DIAGNOSIS — E1140 Type 2 diabetes mellitus with diabetic neuropathy, unspecified: Secondary | ICD-10-CM

## 2020-07-18 DIAGNOSIS — M2040 Other hammer toe(s) (acquired), unspecified foot: Secondary | ICD-10-CM

## 2020-07-18 NOTE — Telephone Encounter (Signed)
Left message for pt that we have not received the documents from the doctor to get the diabetic shoes yet. I did call today and got a different fax number and have faxed it again today to the new number hoping this works.

## 2020-07-18 NOTE — Telephone Encounter (Signed)
Ok thanks 

## 2020-07-18 NOTE — Progress Notes (Signed)
Subjective: Shannon [name] is a 80 y.o. female patient seen in office for follow up evaluation of ulceration of the right 3rd toe ulceration. Reports that it seems a little better using iodosorb and reports that she has 3 more days on antibiotics left. Denies constitutional symptoms.   Patient Active Problem List   Diagnosis Date Noted  . Acute frontal sinusitis 07/04/2020  . Chronic obstructive pulmonary disease ([place]) 07/04/2020  . 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 Essential hypertension 07/04/2020  . Functional urinary incontinence 07/04/2020  . Hyperglycemia due to type 2 diabetes mellitus ([place]) 07/04/2020  . Mixed hyperlipidemia 07/04/2020  . Hypothyroidism 07/04/2020  . Irritable bowel syndrome 07/04/2020  . Other long term (current) drug therapy 07/04/2020  . Other vitamin B12 deficiency anemias 07/04/2020  . Peripheral vascular disease ([place]) 07/04/2020  . Type 2 diabetes mellitus without complications ([place]) 44/96/7591  . Primary generalized (osteo)arthritis 07/04/2020  . Vitamin D deficiency 07/04/2020  . Depression 10/15/2016   Current Outpatient Medications on File Prior to Visit  Medication Sig Dispense Refill  . acetaminophen-codeine (TYLENOL #3) 300-30 MG tablet Take 1 tablet by mouth 2 (two) times daily as needed.    Marland Kitchen aspirin EC 81 MG tablet Take 81 mg by mouth daily.    Marland Kitchen azithromycin (ZITHROMAX) 250 MG tablet Take 250 mg by mouth as directed.    . Black Cohosh 40 MG CAPS 1 tablet in the morning    . butalbital-acetaminophen-caffeine (FIORICET WITH CODEINE) 50-325-40-30 MG capsule Take 1 capsule by mouth every 4 (four) hours as needed for headache.    . carbamide peroxide (DEBROX) 6.5 % OTIC solution 5 drops 2 (two) times daily.    . celecoxib (CELEBREX) 200 MG capsule     . cetirizine (ZYRTEC) 10 MG tablet Take 10 mg by mouth daily.    . ciprofloxacin (CIPRO) 250 MG tablet SMARTSIG:1 Tablet(s) By Mouth Every 12 Hours    .  citalopram (CELEXA) 20 MG tablet Take 20 mg by mouth daily.    . clotrimazole (LOTRIMIN) 1 % cream Apply 1 application topically 2 (two) times daily.    Marland Kitchen donepezil (ARICEPT) 5 MG tablet Take 5 mg by mouth daily.    Marland Kitchen EPINEPHrine (ADRENALIN) 0.1 % nasal solution Place 1 drop into the nose once.    Marland Kitchen EPINEPHrine 0.3 mg/0.3 mL IJ SOAJ injection SMARTSIG:0.3 Milliliter(s) IM Once PRN    . erythromycin ophthalmic ointment SMARTSIG:In Eye(s)    . fluticasone (FLONASE) 50 MCG/ACT nasal spray     . furosemide (LASIX) 20 MG tablet Take 20 mg by mouth.    . gabapentin (NEURONTIN) 600 MG tablet Take 600 mg by mouth 3 (three) times daily.    Marland Kitchen HYDROcodone-acetaminophen (NORCO/VICODIN) 5-325 MG tablet Take 1 tablet by mouth every 6 (six) hours as needed for moderate pain.    . INVOKANA 300 MG TABS     . LANTUS SOLOSTAR 100 UNIT/ML Solostar Pen     . lidocaine (LIDODERM) 5 %     . LORazepam (ATIVAN) 1 MG tablet Take 1 mg by mouth every 8 (eight) hours.    Marland Kitchen losartan (COZAAR) 50 MG tablet     . losartan-hydrochlorothiazide (HYZAAR) 100-25 MG tablet 1 tablet    . lubiprostone (AMITIZA) 24 MCG capsule 1 capsule with food    . metFORMIN (GLUCOPHAGE) 500 MG tablet     . MYRBETRIQ 25 MG TB24 tablet 50 mg.     . nitrofurantoin, macrocrystal-monohydrate, (MACROBID) 100 MG  capsule Take 100 mg by mouth 2 (two) times daily.    Marland Kitchen NOVOFINE 32G X 6 MM MISC     . nystatin (MYCOSTATIN/NYSTOP) 100000 UNIT/GM POWD     . omeprazole (PRILOSEC) 20 MG capsule     . ONGLYZA 5 MG TABS tablet     . pioglitazone-metformin (ACTOPLUS MET) 15-500 MG tablet Take 1 tablet by mouth 2 (two) times daily.    Vladimir Faster Glycol-Propyl Glycol (SYSTANE) 0.4-0.3 % GEL ophthalmic gel Place 1 application into both eyes.    . potassium chloride 20 MEQ/100ML IVPB 1 packet with food    . potassium chloride SA (KLOR-CON) 20 MEQ tablet Take 20 mEq by mouth daily.    . Prodigy Lancets 28G MISC     . Skin Protectants, Misc. (MINERIN) CREA Apply  topically.    . sulfamethoxazole-trimethoprim (BACTRIM) 400-80 MG tablet Take 1 tablet by mouth 2 (two) times daily. 28 tablet 0  . traMADol (ULTRAM) 50 MG tablet Take 50 mg by mouth 2 (two) times daily as needed.    . traMADol (ULTRAM-ER) 200 MG 24 hr tablet     . traZODone (DESYREL) 100 MG tablet Take 100 mg by mouth at bedtime.    . triamcinolone cream (KENALOG) 0.1 % Apply 1 application topically 2 (two) times daily.    . Wound Dressings (MEDIHONEY WOUND/BURN DRESSING) GEL Apply to left toe ulceration 3x per week 15 mL 1   No current facility-administered medications on file prior to visit.   Allergies  Allergen Reactions  . Peanut Oil Anaphylaxis  . Demerol  [Meperidine Hcl] Nausea And Vomiting  . Other Hives    Takes skin off  . Demerol [Meperidine]   . Peanut Butter Flavor   . Tape Dermatitis    No results found for this or any previous visit (from the past 2160 hour(s)).  Objective: There were no vitals filed for this visit.  General: Patient is awake, alert, oriented x 3 and in no acute distress.  Dermatology: Skin is warm and dry bilateral with a partial thickness ulcer at the distal tuft of the right 3rd toe meases 0.4x0.5x0.1cm with a granular base. There is no malodor, no active drainage, + erythema and warmth consistent with history of swelling no other acute signs of infection.  Nails are short and thick bilateral.   Vascular: Dorsalis Pedis pulse = 1/4 Bilateral,  Posterior Tibial pulse = 1/4 Bilateral,  Capillary Fill Time < 5 seconds  Neurologic: Protective sensation intact diminished to the level of midfoot using the 5.07/10g [employer].  Musculosketal: There is no pain with palpation to right third toe.  No pain with compression to calves bilateral. + Hammertoe/digital bony deformities noted bilateral.  Pes planus foot type bilateral.  No results for input(s): GRAMSTAIN, LABORGA in the last 8760 hours.  Assessment and Plan:  Problem List  Items Addressed This Visit   None   Visit Diagnoses    Toe ulcer, right, limited to breakdown of skin (HCC)    -  Primary   Pain in right foot       Hammer toe, unspecified laterality       Type 2 diabetes, controlled, with neuropathy ([place])         -Examined patient  -Discussed wound care -Excisionally dedbrided ulceration at right third toe to healthy bleeding borders removing nonviable tissue using a sterile chisel blade. Wound measures post debridement as above. Wound was debrided to the level of the dermis with viable wound  base exposed to promote healing. Hemostasis was achieved with manuel pressure. Patient tolerated procedure well without any discomfort or anesthesia necessary for this wound debridement.  -Applied Iodosorb and dry sterile dressing and instructed patient to continue with daily dressings at home consisting of the same -Continue with Bactrim until completed  -Awaiting new diabetic shoes  - Advised patient to go to the ER or return to office if the wound worsens or if constitutional symptoms are present. -Return to office in 2 weeks for follow-up wound care  Landis Martins, DPM

## 2020-07-24 ENCOUNTER — Telehealth: Payer: Self-pay | Admitting: Sports Medicine

## 2020-07-24 NOTE — Telephone Encounter (Signed)
Pt left message asking if we got her doctors information for her diabetic shoes.  I returned call and explained that I had called and got a different fax number and faxed it to Dr Minus Breeding and they have not faxed it back. She was going to call them. I told her I faxed it on 3.31.2022

## 2020-08-01 ENCOUNTER — Ambulatory Visit (INDEPENDENT_AMBULATORY_CARE_PROVIDER_SITE_OTHER): Payer: Medicare PPO | Admitting: Sports Medicine

## 2020-08-01 ENCOUNTER — Other Ambulatory Visit: Payer: Self-pay

## 2020-08-01 ENCOUNTER — Encounter: Payer: Self-pay | Admitting: Sports Medicine

## 2020-08-01 DIAGNOSIS — L97511 Non-pressure chronic ulcer of other part of right foot limited to breakdown of skin: Secondary | ICD-10-CM

## 2020-08-01 DIAGNOSIS — M2040 Other hammer toe(s) (acquired), unspecified foot: Secondary | ICD-10-CM

## 2020-08-01 DIAGNOSIS — M79671 Pain in right foot: Secondary | ICD-10-CM

## 2020-08-01 DIAGNOSIS — E1140 Type 2 diabetes mellitus with diabetic neuropathy, unspecified: Secondary | ICD-10-CM

## 2020-08-01 NOTE — Progress Notes (Signed)
Subjective: Shannon [name] is a 80 y.o. female patient seen in office for follow up evaluation of ulceration of the right 3rd toe ulceration. Reports that it seems to be getting better.  Reports that she has taken all of her antibiotics. Denies constitutional symptoms.   Patient Active Problem List   Diagnosis Date Noted  . Acute frontal sinusitis 07/04/2020  . Chronic obstructive pulmonary disease ([place]) 07/04/2020  . Polyneuropathy due to type 2 diabetes mellitus ([place]) 07/04/2020  . Essential hypertension 07/04/2020  . Functional urinary incontinence 07/04/2020  . Hyperglycemia due to type 2 diabetes mellitus ([place]) 07/04/2020  . Mixed hyperlipidemia 07/04/2020  . Hypothyroidism 07/04/2020  . Irritable bowel syndrome 07/04/2020  . Other long term (current) drug therapy 07/04/2020  . Other vitamin B12 deficiency anemias 07/04/2020  . Peripheral vascular disease ([place]) 07/04/2020  . Type 2 diabetes mellitus without complications ([place]) 83/25/4982  . Primary generalized (osteo)arthritis 07/04/2020  . Vitamin D deficiency 07/04/2020  . Depression 10/15/2016   Current Outpatient Medications on File Prior to Visit  Medication Sig Dispense Refill  . acetaminophen-codeine (TYLENOL #3) 300-30 MG tablet Take 1 tablet by mouth 2 (two) times daily as needed.    Marland Kitchen aspirin EC 81 MG tablet Take 81 mg by mouth daily.    Marland Kitchen azithromycin (ZITHROMAX) 250 MG tablet Take 250 mg by mouth as directed.    . Black Cohosh 40 MG CAPS 1 tablet in the morning    . butalbital-acetaminophen-caffeine (FIORICET WITH CODEINE) 50-325-40-30 MG capsule Take 1 capsule by mouth every 4 (four) hours as needed for headache.    . carbamide peroxide (DEBROX) 6.5 % OTIC solution 5 drops 2 (two) times daily.    . celecoxib (CELEBREX) 200 MG capsule     . cetirizine (ZYRTEC) 10 MG tablet Take 10 mg by mouth daily.    . ciprofloxacin (CIPRO) 250 MG tablet SMARTSIG:1 Tablet(s) By Mouth Every 12 Hours    . citalopram (CELEXA) 20  MG tablet Take 20 mg by mouth daily.    . clotrimazole (LOTRIMIN) 1 % cream Apply 1 application topically 2 (two) times daily.    Marland Kitchen donepezil (ARICEPT) 5 MG tablet Take 5 mg by mouth daily.    Marland Kitchen EPINEPHrine (ADRENALIN) 0.1 % nasal solution Place 1 drop into the nose once.    Marland Kitchen EPINEPHrine 0.3 mg/0.3 mL IJ SOAJ injection SMARTSIG:0.3 Milliliter(s) IM Once PRN    . erythromycin ophthalmic ointment SMARTSIG:In Eye(s)    . fluticasone (FLONASE) 50 MCG/ACT nasal spray     . furosemide (LASIX) 20 MG tablet Take 20 mg by mouth.    . gabapentin (NEURONTIN) 600 MG tablet Take 600 mg by mouth 3 (three) times daily.    Marland Kitchen HYDROcodone-acetaminophen (NORCO/VICODIN) 5-325 MG tablet Take 1 tablet by mouth every 6 (six) hours as needed for moderate pain.    . INVOKANA 300 MG TABS     . LANTUS SOLOSTAR 100 UNIT/ML Solostar Pen     . lidocaine (LIDODERM) 5 %     . LORazepam (ATIVAN) 1 MG tablet Take 1 mg by mouth every 8 (eight) hours.    Marland Kitchen losartan (COZAAR) 50 MG tablet     . losartan-hydrochlorothiazide (HYZAAR) 100-25 MG tablet 1 tablet    . lubiprostone (AMITIZA) 24 MCG capsule 1 capsule with food    . metFORMIN (GLUCOPHAGE) 500 MG tablet     . MYRBETRIQ 25 MG TB24 tablet 50 mg.     . nitrofurantoin, macrocrystal-monohydrate, (MACROBID) 100 MG capsule Take  100 mg by mouth 2 (two) times daily.    Marland Kitchen NOVOFINE 32G X 6 MM MISC     . nystatin (MYCOSTATIN/NYSTOP) 100000 UNIT/GM POWD     . omeprazole (PRILOSEC) 20 MG capsule     . ONGLYZA 5 MG TABS tablet     . pioglitazone-metformin (ACTOPLUS MET) 15-500 MG tablet Take 1 tablet by mouth 2 (two) times daily.    Vladimir Faster Glycol-Propyl Glycol (SYSTANE) 0.4-0.3 % GEL ophthalmic gel Place 1 application into both eyes.    . potassium chloride 20 MEQ/100ML IVPB 1 packet with food    . potassium chloride SA (KLOR-CON) 20 MEQ tablet Take 20 mEq by mouth daily.    . Prodigy Lancets 28G MISC     . Skin Protectants, Misc. (MINERIN) CREA Apply topically.    .  sulfamethoxazole-trimethoprim (BACTRIM) 400-80 MG tablet Take 1 tablet by mouth 2 (two) times daily. 28 tablet 0  . traMADol (ULTRAM) 50 MG tablet Take 50 mg by mouth 2 (two) times daily as needed.    . traMADol (ULTRAM-ER) 200 MG 24 hr tablet     . traZODone (DESYREL) 100 MG tablet Take 100 mg by mouth at bedtime.    . triamcinolone cream (KENALOG) 0.1 % Apply 1 application topically 2 (two) times daily.    . Wound Dressings (MEDIHONEY WOUND/BURN DRESSING) GEL Apply to left toe ulceration 3x per week 15 mL 1   No current facility-administered medications on file prior to visit.   Allergies  Allergen Reactions  . Peanut Oil Anaphylaxis  . Demerol  [Meperidine Hcl] Nausea And Vomiting  . Other Hives    Takes skin off  . Demerol [Meperidine]   . Peanut Butter Flavor   . Tape Dermatitis    No results found for this or any previous visit (from the past 2160 hour(s)).  Objective: There were no vitals filed for this visit.  General: Patient is awake, alert, oriented x 3 and in no acute distress.  Dermatology: Skin is warm and dry bilateral with a partial thickness ulcer at the distal tuft of the right 3rd toe meases 0.4x0.4x0.1cm with a granular base. There is no malodor, no active drainage, + erythema and warmth consistent with history of swelling to the right third toe.  There is also a clear fluid blister noted at the right lateral foot. No other acute signs of infection.  Nails are short and thick bilateral.   Vascular: Dorsalis Pedis pulse = 1/4 Bilateral,  Posterior Tibial pulse = 1/4 Bilateral,  Capillary Fill Time < 5 seconds  Neurologic: Protective sensation intact diminished to the level of midfoot using the 5.07/10g [employer].  Musculosketal: There is no pain with palpation to right third toe.  No pain with compression to calves bilateral. + Hammertoe/digital bony deformities noted bilateral.  Pes planus foot type bilateral.  No results for input(s):  GRAMSTAIN, LABORGA in the last 8760 hours.  Assessment and Plan:  Problem List Items Addressed This Visit   None   Visit Diagnoses    Toe ulcer, right, limited to breakdown of skin (HCC)    -  Primary   Pain in right foot       Hammer toe, unspecified laterality       Type 2 diabetes, controlled, with neuropathy ([place])         -Examined patient  -Discussed wound care -Excisionally dedbrided ulceration at right third toe to healthy bleeding borders removing nonviable tissue using a sterile chisel blade.  Also lanced blister using a chisel blade at the right lateral foot.  Wound measures post debridement as above. Wound was debrided to the level of the dermis with viable wound base exposed to promote healing. Hemostasis was achieved with manuel pressure. Patient tolerated procedure well without any discomfort or anesthesia necessary for this wound debridement.  -Applied Betadine to right lateral foot and Iodosorb to right third toe and dry sterile dressing and instructed patient to continue with daily dressings at home consisting of the same - Advised patient to go to the ER or return to office if the wound worsens or if constitutional symptoms are present. -Return to office in 2-3 weeks for follow-up wound care  Landis Martins, DPM

## 2020-08-15 ENCOUNTER — Telehealth: Payer: Self-pay | Admitting: Sports Medicine

## 2020-08-15 ENCOUNTER — Ambulatory Visit (INDEPENDENT_AMBULATORY_CARE_PROVIDER_SITE_OTHER): Payer: Medicare PPO | Admitting: Sports Medicine

## 2020-08-15 ENCOUNTER — Encounter: Payer: Self-pay | Admitting: Sports Medicine

## 2020-08-15 ENCOUNTER — Other Ambulatory Visit: Payer: Self-pay

## 2020-08-15 DIAGNOSIS — B351 Tinea unguium: Secondary | ICD-10-CM

## 2020-08-15 DIAGNOSIS — M79675 Pain in left toe(s): Secondary | ICD-10-CM

## 2020-08-15 DIAGNOSIS — M79671 Pain in right foot: Secondary | ICD-10-CM

## 2020-08-15 DIAGNOSIS — M2040 Other hammer toe(s) (acquired), unspecified foot: Secondary | ICD-10-CM

## 2020-08-15 DIAGNOSIS — L97511 Non-pressure chronic ulcer of other part of right foot limited to breakdown of skin: Secondary | ICD-10-CM

## 2020-08-15 DIAGNOSIS — M79674 Pain in right toe(s): Secondary | ICD-10-CM | POA: Diagnosis not present

## 2020-08-15 DIAGNOSIS — E1140 Type 2 diabetes mellitus with diabetic neuropathy, unspecified: Secondary | ICD-10-CM

## 2020-08-15 NOTE — Telephone Encounter (Signed)
Pt left message yesterday afternoon asking if we had received the corrected documents from her pcp for her diabetic shoes.  I returned call and left message that we did receive documents but they did not have a last office visit date and the date was a stamp and medicare will not allow this. I have refaxed it again with a note again to try and get the paperwork so we can get your shoes.

## 2020-08-15 NOTE — Progress Notes (Signed)
Subjective: Shannon [name] is a 80 y.o. female patient seen in office for follow up evaluation of ulceration of the right 3rd toe ulceration and for nail trim. Reports that it seems to be getting better has been doing dressings each day with improvement. Reports that she is still waiting on diabetic shoes.  Denies constitutional symptoms.   Patient Active Problem List   Diagnosis Date Noted  . Acute frontal sinusitis 07/04/2020  . Chronic obstructive pulmonary disease ([place]) 07/04/2020  . Polyneuropathy due to type 2 diabetes mellitus ([place]) 07/04/2020  . Essential hypertension 07/04/2020  . Functional urinary incontinence 07/04/2020  . Hyperglycemia due to type 2 diabetes mellitus ([place]) 07/04/2020  . Mixed hyperlipidemia 07/04/2020  . Hypothyroidism 07/04/2020  . Irritable bowel syndrome 07/04/2020  . Other long term (current) drug therapy 07/04/2020  . Other vitamin B12 deficiency anemias 07/04/2020  . Peripheral vascular disease ([place]) 07/04/2020  . Type 2 diabetes mellitus without complications ([place]) 38/18/2993  . Primary generalized (osteo)arthritis 07/04/2020  . Vitamin D deficiency 07/04/2020  . Depression 10/15/2016   Current Outpatient Medications on File Prior to Visit  Medication Sig Dispense Refill  . acetaminophen-codeine (TYLENOL #3) 300-30 MG tablet Take 1 tablet by mouth 2 (two) times daily as needed.    Marland Kitchen aspirin EC 81 MG tablet Take 81 mg by mouth daily.    Marland Kitchen azithromycin (ZITHROMAX) 250 MG tablet Take 250 mg by mouth as directed.    . Black Cohosh 40 MG CAPS 1 tablet in the morning    . butalbital-acetaminophen-caffeine (FIORICET WITH CODEINE) 50-325-40-30 MG capsule Take 1 capsule by mouth every 4 (four) hours as needed for headache.    . carbamide peroxide (DEBROX) 6.5 % OTIC solution 5 drops 2 (two) times daily.    . celecoxib (CELEBREX) 200 MG capsule     . cetirizine (ZYRTEC) 10 MG tablet Take 10 mg by mouth daily.    . ciprofloxacin (CIPRO) 250 MG tablet  SMARTSIG:1 Tablet(s) By Mouth Every 12 Hours    . citalopram (CELEXA) 20 MG tablet Take 20 mg by mouth daily.    . clotrimazole (LOTRIMIN) 1 % cream Apply 1 application topically 2 (two) times daily.    Marland Kitchen donepezil (ARICEPT) 5 MG tablet Take 5 mg by mouth daily.    Marland Kitchen EPINEPHrine (ADRENALIN) 0.1 % nasal solution Place 1 drop into the nose once.    Marland Kitchen EPINEPHrine 0.3 mg/0.3 mL IJ SOAJ injection SMARTSIG:0.3 Milliliter(s) IM Once PRN    . erythromycin ophthalmic ointment SMARTSIG:In Eye(s)    . fluticasone (FLONASE) 50 MCG/ACT nasal spray     . furosemide (LASIX) 20 MG tablet Take 20 mg by mouth.    . gabapentin (NEURONTIN) 600 MG tablet Take 600 mg by mouth 3 (three) times daily.    Marland Kitchen HYDROcodone-acetaminophen (NORCO/VICODIN) 5-325 MG tablet Take 1 tablet by mouth every 6 (six) hours as needed for moderate pain.    . INVOKANA 300 MG TABS     . LANTUS SOLOSTAR 100 UNIT/ML Solostar Pen     . lidocaine (LIDODERM) 5 %     . LORazepam (ATIVAN) 1 MG tablet Take 1 mg by mouth every 8 (eight) hours.    Marland Kitchen losartan (COZAAR) 50 MG tablet     . losartan-hydrochlorothiazide (HYZAAR) 100-25 MG tablet 1 tablet    . lubiprostone (AMITIZA) 24 MCG capsule 1 capsule with food    . metFORMIN (GLUCOPHAGE) 500 MG tablet     . MYRBETRIQ 25 MG TB24 tablet 50 mg.     Marland Kitchen  nitrofurantoin, macrocrystal-monohydrate, (MACROBID) 100 MG capsule Take 100 mg by mouth 2 (two) times daily.    Marland Kitchen NOVOFINE 32G X 6 MM MISC     . nystatin (MYCOSTATIN/NYSTOP) 100000 UNIT/GM POWD     . omeprazole (PRILOSEC) 20 MG capsule     . ONGLYZA 5 MG TABS tablet     . pioglitazone-metformin (ACTOPLUS MET) 15-500 MG tablet Take 1 tablet by mouth 2 (two) times daily.    Vladimir Faster Glycol-Propyl Glycol (SYSTANE) 0.4-0.3 % GEL ophthalmic gel Place 1 application into both eyes.    . potassium chloride 20 MEQ/100ML IVPB 1 packet with food    . potassium chloride SA (KLOR-CON) 20 MEQ tablet Take 20 mEq by mouth daily.    . Prodigy Lancets 28G MISC      . Skin Protectants, Misc. (MINERIN) CREA Apply topically.    . sulfamethoxazole-trimethoprim (BACTRIM) 400-80 MG tablet Take 1 tablet by mouth 2 (two) times daily. 28 tablet 0  . traMADol (ULTRAM) 50 MG tablet Take 50 mg by mouth 2 (two) times daily as needed.    . traMADol (ULTRAM-ER) 200 MG 24 hr tablet     . traZODone (DESYREL) 100 MG tablet Take 100 mg by mouth at bedtime.    . triamcinolone cream (KENALOG) 0.1 % Apply 1 application topically 2 (two) times daily.    . Wound Dressings (MEDIHONEY WOUND/BURN DRESSING) GEL Apply to left toe ulceration 3x per week 15 mL 1   No current facility-administered medications on file prior to visit.   Allergies  Allergen Reactions  . Peanut Oil Anaphylaxis  . Demerol  [Meperidine Hcl] Nausea And Vomiting  . Other Hives    Takes skin off  . Demerol [Meperidine]   . Peanut Butter Flavor   . Tape Dermatitis    No results found for this or any previous visit (from the past 2160 hour(s)).  Objective: There were no vitals filed for this visit.  General: Patient is awake, alert, oriented x 3 and in no acute distress.  Dermatology: Skin is warm and dry bilateral with a partial thickness ulcer at the distal tuft of the right 3rd toe measures 0.2x0.2x0.1cm with a granular base. There is no malodor, no active drainage, + erythema and warmth consistent with history of swelling to the right third toe.  There is also a clear fluid blister noted at the right lateral foot. No other acute signs of infection.  Nails are elongated and thick bilateral.   Vascular: Dorsalis Pedis pulse = 1/4 Bilateral,  Posterior Tibial pulse = 1/4 Bilateral,  Capillary Fill Time < 5 seconds  Neurologic: Protective sensation intact diminished to the level of midfoot using the 5.07/10g [employer].  Musculosketal: There is no pain with palpation to right third toe.  No pain with compression to calves bilateral. + Hammertoe/digital bony deformities noted  bilateral.  Pes planus foot type bilateral.  No results for input(s): GRAMSTAIN, LABORGA in the last 8760 hours.  Assessment and Plan:  Problem List Items Addressed This Visit   None   Visit Diagnoses    Pain due to onychomycosis of toenails of both feet    -  Primary   Toe ulcer, right, limited to breakdown of skin (HCC)       Pain in right foot       Hammer toe, unspecified laterality       Type 2 diabetes, controlled, with neuropathy ([place])         -Examined patient  -Discussed  wound care -Excisionally dedbrided ulceration at right third toe to healthy bleeding borders removing nonviable tissue using a sterile chisel blade.  Also lanced blister using a chisel blade at the right lateral foot.  Wound measures post debridement as above. Wound was debrided to the level of the dermis with viable wound base exposed to promote healing. Hemostasis was achieved with manuel pressure. Patient tolerated procedure well without any discomfort or anesthesia necessary for this wound debridement.  -Applied Betadine to right lateral foot and Iodosorb to right third toe and dry sterile dressing and instructed patient to continue with daily dressings at home consisting of the same - Advised patient to go to the ER or return to office if the wound worsens or if constitutional symptoms are present. -Mechanically debrided nails x 10 using a sterile nail nipper without incident -Awaiting diabetic shoes -Return to office in 3-4 weeks for follow-up wound care  Landis Martins, DPM

## 2020-09-02 ENCOUNTER — Telehealth: Payer: Self-pay | Admitting: Sports Medicine

## 2020-09-02 NOTE — Telephone Encounter (Signed)
Pt left message checking on diabetic shoe paperwork.   I returned call and we have not received the correct documents back and I have hand faxed it 2 times with a note to see if we could get it corrected. Pt to call the office to see what she can get done.

## 2020-09-12 ENCOUNTER — Other Ambulatory Visit: Payer: Self-pay

## 2020-09-12 ENCOUNTER — Ambulatory Visit (INDEPENDENT_AMBULATORY_CARE_PROVIDER_SITE_OTHER): Payer: Medicare PPO | Admitting: Sports Medicine

## 2020-09-12 ENCOUNTER — Encounter: Payer: Self-pay | Admitting: Sports Medicine

## 2020-09-12 VITALS — Temp 98.7°F

## 2020-09-12 DIAGNOSIS — M79671 Pain in right foot: Secondary | ICD-10-CM

## 2020-09-12 DIAGNOSIS — L97511 Non-pressure chronic ulcer of other part of right foot limited to breakdown of skin: Secondary | ICD-10-CM

## 2020-09-12 DIAGNOSIS — M2040 Other hammer toe(s) (acquired), unspecified foot: Secondary | ICD-10-CM | POA: Diagnosis not present

## 2020-09-12 DIAGNOSIS — E1140 Type 2 diabetes mellitus with diabetic neuropathy, unspecified: Secondary | ICD-10-CM

## 2020-09-12 NOTE — Progress Notes (Signed)
Subjective: Shannon [name] is a 80 y.o. female patient seen in office for follow up evaluation of ulceration of the right 3rd toe ulceration. Reports that she is doing good. Using idosorb to the toe with no issues.  Denies constitutional symptoms.   Patient Active Problem List   Diagnosis Date Noted  . Acute frontal sinusitis 07/04/2020  . Chronic obstructive pulmonary disease ([place]) 07/04/2020  . Polyneuropathy due to type 2 diabetes mellitus ([place]) 07/04/2020  . Essential hypertension 07/04/2020  . Functional urinary incontinence 07/04/2020  . Hyperglycemia due to type 2 diabetes mellitus ([place]) 07/04/2020  . Mixed hyperlipidemia 07/04/2020  . Hypothyroidism 07/04/2020  . Irritable bowel syndrome 07/04/2020  . Other long term (current) drug therapy 07/04/2020  . Other vitamin B12 deficiency anemias 07/04/2020  . Peripheral vascular disease ([name]) 07/04/2020  . Type 2 diabetes mellitus without complications ([place]) 30/12/2328  . Primary generalized (osteo)arthritis 07/04/2020  . Vitamin D deficiency 07/04/2020  . Depression 10/15/2016   Current Outpatient Medications on File Prior to Visit  Medication Sig Dispense Refill  . acetaminophen-codeine (TYLENOL #3) 300-30 MG tablet Take 1 tablet by mouth 2 (two) times daily as needed.    Marland Kitchen aspirin EC 81 MG tablet Take 81 mg by mouth daily.    Marland Kitchen azithromycin (ZITHROMAX) 250 MG tablet Take 250 mg by mouth as directed.    . Black Cohosh 40 MG CAPS 1 tablet in the morning    . butalbital-acetaminophen-caffeine (FIORICET WITH CODEINE) 50-325-40-30 MG capsule Take 1 capsule by mouth every 4 (four) hours as needed for headache.    . carbamide peroxide (DEBROX) 6.5 % OTIC solution 5 drops 2 (two) times daily.    . celecoxib (CELEBREX) 200 MG capsule     . cetirizine (ZYRTEC) 10 MG tablet Take 10 mg by mouth daily.    . ciprofloxacin (CIPRO) 250 MG tablet SMARTSIG:1 Tablet(s) By Mouth Every 12 Hours    . citalopram (CELEXA) 20 MG tablet Take 20 mg by  mouth daily.    . clotrimazole (LOTRIMIN) 1 % cream Apply 1 application topically 2 (two) times daily.    Marland Kitchen donepezil (ARICEPT) 5 MG tablet Take 5 mg by mouth daily.    Marland Kitchen EPINEPHrine (ADRENALIN) 0.1 % nasal solution Place 1 drop into the nose once.    Marland Kitchen EPINEPHrine 0.3 mg/0.3 mL IJ SOAJ injection SMARTSIG:0.3 Milliliter(s) IM Once PRN    . erythromycin ophthalmic ointment SMARTSIG:In Eye(s)    . fluticasone (FLONASE) 50 MCG/ACT nasal spray     . furosemide (LASIX) 20 MG tablet Take 20 mg by mouth.    . gabapentin (NEURONTIN) 600 MG tablet Take 600 mg by mouth 3 (three) times daily.    Marland Kitchen HYDROcodone-acetaminophen (NORCO/VICODIN) 5-325 MG tablet Take 1 tablet by mouth every 6 (six) hours as needed for moderate pain.    . INVOKANA 300 MG TABS     . LANTUS SOLOSTAR 100 UNIT/ML Solostar Pen     . lidocaine (LIDODERM) 5 %     . LORazepam (ATIVAN) 1 MG tablet Take 1 mg by mouth every 8 (eight) hours.    Marland Kitchen losartan (COZAAR) 50 MG tablet     . losartan-hydrochlorothiazide (HYZAAR) 100-25 MG tablet 1 tablet    . lubiprostone (AMITIZA) 24 MCG capsule 1 capsule with food    . metFORMIN (GLUCOPHAGE) 500 MG tablet     . MYRBETRIQ 25 MG TB24 tablet 50 mg.     . nitrofurantoin, macrocrystal-monohydrate, (MACROBID) 100 MG capsule Take 100 mg by  mouth 2 (two) times daily.    Marland Kitchen NOVOFINE 32G X 6 MM MISC     . nystatin (MYCOSTATIN/NYSTOP) 100000 UNIT/GM POWD     . omeprazole (PRILOSEC) 20 MG capsule     . ONGLYZA 5 MG TABS tablet     . pioglitazone-metformin (ACTOPLUS MET) 15-500 MG tablet Take 1 tablet by mouth 2 (two) times daily.    Vladimir Faster Glycol-Propyl Glycol (SYSTANE) 0.4-0.3 % GEL ophthalmic gel Place 1 application into both eyes.    . potassium chloride 20 MEQ/100ML IVPB 1 packet with food    . potassium chloride SA (KLOR-CON) 20 MEQ tablet Take 20 mEq by mouth daily.    . Prodigy Lancets 28G MISC     . Skin Protectants, Misc. (MINERIN) CREA Apply topically.    . sulfamethoxazole-trimethoprim  (BACTRIM) 400-80 MG tablet Take 1 tablet by mouth 2 (two) times daily. 28 tablet 0  . traMADol (ULTRAM) 50 MG tablet Take 50 mg by mouth 2 (two) times daily as needed.    . traMADol (ULTRAM-ER) 200 MG 24 hr tablet     . traZODone (DESYREL) 100 MG tablet Take 100 mg by mouth at bedtime.    . triamcinolone cream (KENALOG) 0.1 % Apply 1 application topically 2 (two) times daily.    . Wound Dressings (MEDIHONEY WOUND/BURN DRESSING) GEL Apply to left toe ulceration 3x per week 15 mL 1   No current facility-administered medications on file prior to visit.   Allergies  Allergen Reactions  . Peanut Oil Anaphylaxis  . Demerol  [Meperidine Hcl] Nausea And Vomiting  . Other Hives    Takes skin off  . Demerol [Meperidine]   . Peanut Butter Flavor   . Tape Dermatitis    No results found for this or any previous visit (from the past 2160 hour(s)).  Objective: There were no vitals filed for this visit.  General: Patient is awake, alert, oriented x 3 and in no acute distress.  Dermatology: Skin is warm and dry bilateral with a now healed wound right 3rd toe, resolved erythema.  Previous fluid blister resolved at the right lateral foot. No other acute signs of infection.  Nails are elongated and thick bilateral.   Vascular: Dorsalis Pedis pulse = 1/4 Bilateral,  Posterior Tibial pulse = 1/4 Bilateral,  Capillary Fill Time < 5 seconds  Neurologic: Protective sensation intact diminished to the level of midfoot using the 5.07/10g [employer].  Musculosketal: There is no pain with palpation to right third toe or lateral foot.  No pain with compression to calves bilateral. + Hammertoe/digital bony deformities noted bilateral.  Pes planus foot type bilateral.  No results for input(s): GRAMSTAIN, LABORGA in the last 8760 hours.  Assessment and Plan:  Problem List Items Addressed This Visit   None   Visit Diagnoses    Toe ulcer, right, limited to breakdown of skin (HCC)    -   Primary   Healed    Pain in right foot       Hammer toe, unspecified laterality       Type 2 diabetes, controlled, with neuropathy ([place])         -Examined patient  -Wound healed at right 3rd toe  -Dressing no longer needed at this time -Awaiting diabetic shoes -Return to office in 10 weeks for nail care.  Landis Martins, DPM

## 2020-11-21 ENCOUNTER — Other Ambulatory Visit: Payer: Self-pay

## 2020-11-21 ENCOUNTER — Encounter: Payer: Self-pay | Admitting: Sports Medicine

## 2020-11-21 ENCOUNTER — Ambulatory Visit (INDEPENDENT_AMBULATORY_CARE_PROVIDER_SITE_OTHER): Payer: Medicare PPO | Admitting: Sports Medicine

## 2020-11-21 DIAGNOSIS — E1140 Type 2 diabetes mellitus with diabetic neuropathy, unspecified: Secondary | ICD-10-CM

## 2020-11-21 DIAGNOSIS — M79675 Pain in left toe(s): Secondary | ICD-10-CM

## 2020-11-21 DIAGNOSIS — B351 Tinea unguium: Secondary | ICD-10-CM

## 2020-11-21 DIAGNOSIS — M79674 Pain in right toe(s): Secondary | ICD-10-CM

## 2020-11-21 DIAGNOSIS — M2040 Other hammer toe(s) (acquired), unspecified foot: Secondary | ICD-10-CM

## 2020-11-21 NOTE — Progress Notes (Signed)
Patient ID: Shannon [name], female   DOB: 1941/04/03, 80 y.o.   MRN: [id number] Subjective: Shannon [name] is a 80 y.o. female patient with history of type 2 diabetes who returns to office today complaining of long, painful nails while ambulating in shoes; unable to trim. Patient states that the glucose readings have been good 100 today and last A1c a few weeks ago 6 and states that she is still having a difficult time getting approved for shoes because of her PCP not signing off on the proper paperwork  Patient lives at [facility].  Patient Active Problem List   Diagnosis Date Noted   Acute frontal sinusitis 07/04/2020   Chronic obstructive pulmonary disease ([place]) 07/04/2020   Polyneuropathy due to type 2 diabetes mellitus ([place]) 07/04/2020   Essential hypertension 07/04/2020   Functional urinary incontinence 07/04/2020   Hyperglycemia due to type 2 diabetes mellitus ([place]) 07/04/2020   Mixed hyperlipidemia 07/04/2020   Hypothyroidism 07/04/2020   Irritable bowel syndrome 07/04/2020   Other long term (current) drug therapy 07/04/2020   Other vitamin B12 deficiency anemias 07/04/2020   Peripheral vascular disease ([place]) 07/04/2020   Type 2 diabetes mellitus without complications ([place]) 08/81/1031   Primary generalized (osteo)arthritis 07/04/2020   Vitamin D deficiency 07/04/2020   Depression 10/15/2016   Current Outpatient Medications on File Prior to Visit  Medication Sig Dispense Refill   acetaminophen-codeine (TYLENOL #3) 300-30 MG tablet Take 1 tablet by mouth 2 (two) times daily as needed.     aspirin EC 81 MG tablet Take 81 mg by mouth daily.     azithromycin (ZITHROMAX) 250 MG tablet Take 250 mg by mouth as directed.     Black Cohosh 40 MG CAPS 1 tablet in the morning     butalbital-acetaminophen-caffeine (FIORICET WITH CODEINE) 50-325-40-30 MG capsule Take 1 capsule by mouth every 4 (four) hours as needed for headache.     carbamide peroxide (DEBROX)  6.5 % OTIC solution 5 drops 2 (two) times daily.     celecoxib (CELEBREX) 200 MG capsule      cetirizine (ZYRTEC) 10 MG tablet Take 10 mg by mouth daily.     ciprofloxacin (CIPRO) 250 MG tablet SMARTSIG:1 Tablet(s) By Mouth Every 12 Hours     citalopram (CELEXA) 20 MG tablet Take 20 mg by mouth daily.     clotrimazole (LOTRIMIN) 1 % cream Apply 1 application topically 2 (two) times daily.     donepezil (ARICEPT) 5 MG tablet Take 5 mg by mouth daily.     EPINEPHrine (ADRENALIN) 0.1 % nasal solution Place 1 drop into the nose once.     EPINEPHrine 0.3 mg/0.3 mL IJ SOAJ injection SMARTSIG:0.3 Milliliter(s) IM Once PRN     erythromycin ophthalmic ointment SMARTSIG:In Eye(s)     fluticasone (FLONASE) 50 MCG/ACT nasal spray      furosemide (LASIX) 20 MG tablet Take 20 mg by mouth.     gabapentin (NEURONTIN) 600 MG tablet Take 600 mg by mouth 3 (three) times daily.     HYDROcodone-acetaminophen (NORCO/VICODIN) 5-325 MG tablet Take 1 tablet by mouth every 6 (six) hours as needed for moderate pain.     INVOKANA 300 MG TABS      LANTUS SOLOSTAR 100 UNIT/ML Solostar Pen      lidocaine (LIDODERM) 5 %      LORazepam (ATIVAN) 1 MG tablet Take 1 mg by mouth every 8 (eight) hours.     losartan (COZAAR) 50 MG tablet  losartan-hydrochlorothiazide (HYZAAR) 100-25 MG tablet 1 tablet     lubiprostone (AMITIZA) 24 MCG capsule 1 capsule with food     metFORMIN (GLUCOPHAGE) 500 MG tablet      MYRBETRIQ 25 MG TB24 tablet 50 mg.      nitrofurantoin, macrocrystal-monohydrate, (MACROBID) 100 MG capsule Take 100 mg by mouth 2 (two) times daily.     NOVOFINE 32G X 6 MM MISC      nystatin (MYCOSTATIN/NYSTOP) 100000 UNIT/GM POWD      omeprazole (PRILOSEC) 20 MG capsule      ONGLYZA 5 MG TABS tablet      pioglitazone-metformin (ACTOPLUS MET) 15-500 MG tablet Take 1 tablet by mouth 2 (two) times daily.     Polyethyl Glycol-Propyl Glycol (SYSTANE) 0.4-0.3 % GEL ophthalmic gel Place 1 application into both eyes.      potassium chloride 20 MEQ/100ML IVPB 1 packet with food     potassium chloride SA (KLOR-CON) 20 MEQ tablet Take 20 mEq by mouth daily.     Prodigy Lancets 28G MISC      Skin Protectants, Misc. (MINERIN) CREA Apply topically.     sulfamethoxazole-trimethoprim (BACTRIM) 400-80 MG tablet Take 1 tablet by mouth 2 (two) times daily. 28 tablet 0   traMADol (ULTRAM) 50 MG tablet Take 50 mg by mouth 2 (two) times daily as needed.     traMADol (ULTRAM-ER) 200 MG 24 hr tablet      traZODone (DESYREL) 100 MG tablet Take 100 mg by mouth at bedtime.     triamcinolone cream (KENALOG) 0.1 % Apply 1 application topically 2 (two) times daily.     Wound Dressings (MEDIHONEY WOUND/BURN DRESSING) GEL Apply to left toe ulceration 3x per week 15 mL 1   No current facility-administered medications on file prior to visit.   Allergies  Allergen Reactions   Peanut Oil Anaphylaxis   Demerol  [Meperidine Hcl] Nausea And Vomiting   Other Hives    Takes skin off   Demerol [Meperidine]    Peanut Butter Flavor    Tape Dermatitis   Objective: General: Patient is awake, alert, and oriented x 3 and in no acute distress.  Integument: Skin is warm, dry and supple bilateral. Nails are tender, long, thickened and dystrophic with subungual debris, consistent with onychomycosis, 1-5 bilateral.  Previous wounds healed there is a small amount of rubbing noted at the right great toe but no underlying wound or opening. Old surgical scars Remaining integument unremarkable.  Vasculature:  Dorsalis Pedis pulse1/4 bilateral. Posterior Tibial pulse 1 /4 bilateral.  Capillary fill time <4 sec 1-5 bilateral. Scant hair growth to the level of the digits. Temperature gradient within normal limits.  + varicosities present bilateral. No edema present bilateral.   Neurology: The patient has absent sensation measured with a 5.07/10g Semmes Weinstein Monofilament at all pedal sites bilateral . Vibratory sensation diminished bilateral with  tuning fork. No Babinski sign present bilateral.   Musculoskeletal: Bunion with crossover and hammertoes deformities noted bilateral, midfoot exostosis on right with bilateral pes planus. Muscular strength 5/5 in all lower extremity muscular groups bilateral without pain or limitation on range of motion . No tenderness with calf compression bilateral.  Assessment and Plan: Problem List Items Addressed This Visit   None Visit Diagnoses     Pain due to onychomycosis of toenails of both feet    -  Primary   Hammer toe, unspecified laterality       Type 2 diabetes, controlled, with neuropathy ([place])          -  Examined patient. -Discussed and educated patient on diabetic foot care, especially with  regards to the vascular, neurological and musculoskeletal systems.  -Mechanically debrided all nails 1-5 bilateral using sterile nail nipper and filed with dremel without incident. -Advised good supportive shoes that do not crowd or rub toes may continue with a protective Band-Aid as needed to the right great toe when in a shoe -Answered all patient questions -Patient to return in 2.5 months for at risk foot care -Patient advised to call the office if any problems or questions arise in the meantime.  Landis Martins, DPM

## 2020-12-05 ENCOUNTER — Other Ambulatory Visit: Payer: Self-pay | Admitting: Nurse Practitioner

## 2020-12-05 DIAGNOSIS — Z1231 Encounter for screening mammogram for malignant neoplasm of breast: Secondary | ICD-10-CM

## 2021-01-07 ENCOUNTER — Telehealth: Payer: Self-pay

## 2021-01-07 NOTE — Telephone Encounter (Signed)
Notes on file from [facility] Shannon [name], [phone]. Referral sent to scheduling.

## 2021-01-20 ENCOUNTER — Other Ambulatory Visit: Payer: Self-pay

## 2021-01-20 ENCOUNTER — Ambulatory Visit
Admission: RE | Admit: 2021-01-20 | Discharge: 2021-01-20 | Disposition: A | Discharge status: Home / Self Care | Payer: Medicare PPO | Source: Ambulatory Visit | Attending: Nurse Practitioner | Admitting: Nurse Practitioner

## 2021-01-20 DIAGNOSIS — Z1231 Encounter for screening mammogram for malignant neoplasm of breast: Secondary | ICD-10-CM

## 2021-01-20 HISTORY — DX: Hypokalemia: E87.6

## 2021-01-20 HISTORY — DX: Unspecified osteoarthritis, unspecified site: M19.90

## 2021-01-20 HISTORY — DX: Type 2 diabetes mellitus without complications: E11.9

## 2021-01-23 ENCOUNTER — Ambulatory Visit (INDEPENDENT_AMBULATORY_CARE_PROVIDER_SITE_OTHER): Payer: Medicare PPO | Admitting: Sports Medicine

## 2021-01-23 ENCOUNTER — Encounter: Payer: Self-pay | Admitting: Sports Medicine

## 2021-01-23 ENCOUNTER — Other Ambulatory Visit: Payer: Self-pay

## 2021-01-23 DIAGNOSIS — E1140 Type 2 diabetes mellitus with diabetic neuropathy, unspecified: Secondary | ICD-10-CM

## 2021-01-23 DIAGNOSIS — M79675 Pain in left toe(s): Secondary | ICD-10-CM

## 2021-01-23 DIAGNOSIS — M2040 Other hammer toe(s) (acquired), unspecified foot: Secondary | ICD-10-CM

## 2021-01-23 DIAGNOSIS — B351 Tinea unguium: Secondary | ICD-10-CM | POA: Diagnosis not present

## 2021-01-23 DIAGNOSIS — M79674 Pain in right toe(s): Secondary | ICD-10-CM

## 2021-01-23 NOTE — Progress Notes (Signed)
Patient ID: Shannon [name], female   DOB: 10-Aug-1940, 80 y.o.   MRN: [id number] Subjective: Shannon [name] is a 80 y.o. female patient with history of type 2 diabetes who returns to office today complaining of long, painful nails while ambulating in shoes; unable to trim.  Fasting blood sugar not recorded this visit.  Patient lives at [facility] facility.  Patient Active Problem List   Diagnosis Date Noted   Acute frontal sinusitis 07/04/2020   Chronic obstructive pulmonary disease ([place]) 07/04/2020   Polyneuropathy due to type 2 diabetes mellitus ([place]) 07/04/2020   Essential hypertension 07/04/2020   Functional urinary incontinence 07/04/2020   Hyperglycemia due to type 2 diabetes mellitus ([place]) 07/04/2020   Mixed hyperlipidemia 07/04/2020   Hypothyroidism 07/04/2020   Irritable bowel syndrome 07/04/2020   Other long term (current) drug therapy 07/04/2020   Other vitamin B12 deficiency anemias 07/04/2020   Peripheral vascular disease ([place]) 07/04/2020   Type 2 diabetes mellitus without complications ([place]) 16/10/3708   Primary generalized (osteo)arthritis 07/04/2020   Vitamin D deficiency 07/04/2020   Depression 10/15/2016   Current Outpatient Medications on File Prior to Visit  Medication Sig Dispense Refill   acetaminophen-codeine (TYLENOL #3) 300-30 MG tablet Take 1 tablet by mouth 2 (two) times daily as needed.     aspirin 81 MG EC tablet      aspirin EC 81 MG tablet Take 81 mg by mouth daily.     azithromycin (ZITHROMAX) 250 MG tablet Take 250 mg by mouth as directed.     Black Cohosh 40 MG CAPS 1 tablet in the morning     butalbital-acetaminophen-caffeine (FIORICET WITH CODEINE) 50-325-40-30 MG capsule Take 1 capsule by mouth every 4 (four) hours as needed for headache.     carbamide peroxide (DEBROX) 6.5 % OTIC solution 5 drops 2 (two) times daily.     celecoxib (CELEBREX) 200 MG capsule      celecoxib (CELEBREX) 200 MG capsule      cetirizine (ZYRTEC) 10  MG tablet Take 10 mg by mouth daily.     cetirizine (ZYRTEC) 10 MG tablet      ciprofloxacin (CIPRO) 250 MG tablet SMARTSIG:1 Tablet(s) By Mouth Every 12 Hours     citalopram (CELEXA) 20 MG tablet Take 20 mg by mouth daily.     clotrimazole (LOTRIMIN) 1 % cream Apply 1 application topically 2 (two) times daily.     donepezil (ARICEPT) 5 MG tablet Take 5 mg by mouth daily.     EPINEPHrine (ADRENALIN) 0.1 % nasal solution Place 1 drop into the nose once.     EPINEPHrine 0.3 mg/0.3 mL IJ SOAJ injection SMARTSIG:0.3 Milliliter(s) IM Once PRN     erythromycin ophthalmic ointment SMARTSIG:In Eye(s)     fluticasone (FLONASE) 50 MCG/ACT nasal spray      furosemide (LASIX) 20 MG tablet Take 20 mg by mouth.     gabapentin (NEURONTIN) 600 MG tablet Take 600 mg by mouth 3 (three) times daily.     gabapentin (NEURONTIN) 600 MG tablet      HYDROcodone-acetaminophen (NORCO/VICODIN) 5-325 MG tablet Take 1 tablet by mouth every 6 (six) hours as needed for moderate pain.     INVOKANA 300 MG TABS      LANTUS SOLOSTAR 100 UNIT/ML Solostar Pen      lidocaine (LIDODERM) 5 %      LORazepam (ATIVAN) 1 MG tablet Take 1 mg by mouth every 8 (eight) hours.     losartan (COZAAR) 25 MG tablet  Take 25 mg by mouth daily.     losartan (COZAAR) 50 MG tablet      losartan-hydrochlorothiazide (HYZAAR) 100-25 MG tablet 1 tablet     lubiprostone (AMITIZA) 24 MCG capsule 1 capsule with food     metFORMIN (GLUCOPHAGE) 500 MG tablet      metFORMIN (GLUCOPHAGE) 500 MG tablet      MYRBETRIQ 25 MG TB24 tablet 50 mg.      nitrofurantoin, macrocrystal-monohydrate, (MACROBID) 100 MG capsule Take 100 mg by mouth 2 (two) times daily.     NOVOFINE 32G X 6 MM MISC      nystatin (MYCOSTATIN/NYSTOP) 100000 UNIT/GM POWD      omeprazole (PRILOSEC) 20 MG capsule      omeprazole (PRILOSEC) 20 MG capsule      ONGLYZA 5 MG TABS tablet      pioglitazone-metformin (ACTOPLUS MET) 15-500 MG tablet Take 1 tablet by mouth 2 (two) times daily.      Polyethyl Glycol-Propyl Glycol (SYSTANE) 0.4-0.3 % GEL ophthalmic gel Place 1 application into both eyes.     potassium chloride 20 MEQ/100ML IVPB 1 packet with food     potassium chloride SA (KLOR-CON) 20 MEQ tablet Take 20 mEq by mouth daily.     Prodigy Lancets 28G MISC      Skin Protectants, Misc. (MINERIN) CREA Apply topically.     sulfamethoxazole-trimethoprim (BACTRIM) 400-80 MG tablet Take 1 tablet by mouth 2 (two) times daily. 28 tablet 0   traMADol (ULTRAM) 50 MG tablet Take 50 mg by mouth 2 (two) times daily as needed.     traMADol (ULTRAM) 50 MG tablet      traMADol (ULTRAM-ER) 200 MG 24 hr tablet      traZODone (DESYREL) 100 MG tablet Take 100 mg by mouth at bedtime.     triamcinolone cream (KENALOG) 0.1 % Apply 1 application topically 2 (two) times daily.     Wound Dressings (MEDIHONEY WOUND/BURN DRESSING) GEL Apply to left toe ulceration 3x per week 15 mL 1   No current facility-administered medications on file prior to visit.   Allergies  Allergen Reactions   Peanut Oil Anaphylaxis   Demerol  [Meperidine Hcl] Nausea And Vomiting   Other Hives    Takes skin off   Demerol [Meperidine]    Peanut Butter Flavor    Tape Dermatitis   Objective: General: Patient is awake, alert, and oriented x 3 and in no acute distress.  Integument: Skin is warm, dry and supple bilateral. Nails are tender, long, thickened and dystrophic with subungual debris, consistent with onychomycosis, 1-5 bilateral.  Previous wounds remains healed. Old surgical scars Remaining integument unremarkable.  Vasculature:  Dorsalis Pedis pulse1/4 bilateral. Posterior Tibial pulse 1 /4 bilateral. Capillary fill time <4 sec 1-5 bilateral. Scant hair growth to the level of the digits.Temperature gradient within normal limits.  + varicosities present bilateral. No edema present bilateral.   Neurology: Gross sensation present via light touch.  Protective sensation however is severely diminished  bilateral.  Musculoskeletal: Bunion with crossover and hammertoes deformities noted bilateral, midfoot exostosis on right with bilateral pes planus. Muscular strength 5/5 in all lower extremity muscular groups bilateral without pain or limitation on range of motion . No tenderness with calf compression bilateral.  Assessment and Plan: Problem List Items Addressed This Visit   None Visit Diagnoses     Pain due to onychomycosis of toenails of both feet    -  Primary   Hammer toe, unspecified laterality  Type 2 diabetes, controlled, with neuropathy (HCC)       Relevant Medications   aspirin 81 MG EC tablet   losartan (COZAAR) 25 MG tablet   metFORMIN (GLUCOPHAGE) 500 MG tablet      -Examined patient. -Discussed and educated patient on diabetic foot care, especially with  regards to the vascular, neurological and musculoskeletal systems.  -Mechanically debrided all nails 1-5 bilateral using sterile nail nipper in girth and length and filed with dremel without incident. -Answered all patient questions -Patient to return in 2.5 months for at risk foot care -Patient advised to call the office if any problems or questions arise in the meantime.  Landis Martins, DPM

## 2021-02-19 ENCOUNTER — Ambulatory Visit: Payer: Medicare PPO | Admitting: Neurology

## 2021-03-02 ENCOUNTER — Encounter: Payer: Self-pay | Admitting: Cardiovascular Disease

## 2021-03-02 NOTE — Progress Notes (Signed)
Cardiology Office Note:    Date:  03/03/2021   ID:  Shannon [name], DOB 04/21/1940, MRN [id number]  PCP:  Roetta Sessions, NP   [facility] Cardiologist: Nichollas Perusse  }    Referring MD: Roetta Sessions*   Chief Complaint  Patient presents with   Hypertension         Nov. 14, 2022   Shannon [name] is a 80 y.o. female with a hx of HTN, HLD. We were asked to see her today by Clide Deutscher, NP for further discussion of her HTN and HLD   Has seen cardiology in the past Was told that she had some mild carotid plaque No cp or dyspnea Walks in the halls ,  exercises on a recombant elliptical or bike  Feels well afterwards    Is at [facility].  Diet is fairly low in salt .. she does not add salt      Past Medical History:  Diagnosis Date   Allergy    Anxiety    Arthritis    Cancer ([place])    Cataract    Dementia ([place])    Dementia ([place])    Depression    Diabetes mellitus without complication ([place])    DM (diabetes mellitus) ([place])    GERD (gastroesophageal reflux disease)    Hyperlipidemia    Hypertension    Hypokalemia    Osteoarthritis    Stroke ([place])    Ulcer     History reviewed. No pertinent surgical history.  Current Medications: Current Meds  Medication Sig   aspirin 81 MG EC tablet    Black Cohosh 40 MG CAPS 1 tablet in the morning   butalbital-acetaminophen-caffeine (FIORICET WITH CODEINE) 50-325-40-30 MG capsule Take 1 capsule by mouth every 4 (four) hours as needed for headache.   carbamide peroxide (DEBROX) 6.5 % OTIC solution 5 drops 2 (two) times daily.   celecoxib (CELEBREX) 200 MG capsule    cetirizine (ZYRTEC) 10 MG tablet    citalopram (CELEXA) 20 MG tablet Take 20 mg by mouth daily.   clotrimazole (LOTRIMIN) 1 % cream Apply 1 application topically 2 (two) times daily.   donepezil (ARICEPT) 5 MG tablet Take 5 mg by mouth daily.   EPINEPHrine (ADRENALIN) 0.1 % nasal solution Place 1 drop into the nose once.    EPINEPHrine 0.3 mg/0.3 mL IJ SOAJ injection SMARTSIG:0.3 Milliliter(s) IM Once PRN   fluticasone (FLONASE) 50 MCG/ACT nasal spray    furosemide (LASIX) 20 MG tablet Take 20 mg by mouth.   gabapentin (NEURONTIN) 600 MG tablet    INVOKANA 300 MG TABS    LANTUS SOLOSTAR 100 UNIT/ML Solostar Pen    LORazepam (ATIVAN) 1 MG tablet Take 1 mg by mouth every 8 (eight) hours.   losartan (COZAAR) 25 MG tablet Take 25 mg by mouth daily.   lubiprostone (AMITIZA) 24 MCG capsule 1 capsule with food   metFORMIN (GLUCOPHAGE) 500 MG tablet    MYRBETRIQ 25 MG TB24 tablet 50 mg.    NOVOFINE 32G X 6 MM MISC    omeprazole (PRILOSEC) 20 MG capsule    ONGLYZA 5 MG TABS tablet    pioglitazone-metformin (ACTOPLUS MET) 15-500 MG tablet Take 1 tablet by mouth 2 (two) times daily.   potassium chloride 20 MEQ/100ML IVPB 1 packet with food   Prodigy Lancets 28G MISC    Skin Protectants, Misc. (MINERIN) CREA Apply topically.   traMADol (ULTRAM) 50 MG tablet    traZODone (DESYREL) 100 MG tablet  Take 100 mg by mouth at bedtime.   triamcinolone cream (KENALOG) 0.1 % Apply 1 application topically 2 (two) times daily.   Wound Dressings (MEDIHONEY WOUND/BURN DRESSING) GEL Apply to left toe ulceration 3x per week     Allergies:   Peanut oil, Demerol  [meperidine hcl], Other, Demerol [meperidine], Peanut butter flavor, and Tape   Social History   Socioeconomic History   Marital status: Unknown    Spouse name: Not on file   Number of children: Not on file   Years of education: Not on file   Highest education level: Not on file  Occupational History   Not on file  Tobacco Use   Smoking status: Never   Smokeless tobacco: Never  Substance and Sexual Activity   Alcohol use: No   Drug use: No   Sexual activity: Not on file  Other Topics Concern   Not on file  Social History Narrative   Not on file   Social Determinants of Health   Financial Resource Strain: Not on file  Food Insecurity: Not on file   Transportation Needs: Not on file  Physical Activity: Not on file  Stress: Not on file  Social Connections: Not on file     Family History: The patient's family history includes Breast cancer in her sister.  ROS:   Please see the history of present illness.     All other systems reviewed and are negative.  EKGs/Labs/Other Studies Reviewed:    The following studies were reviewed today:   EKG:    Nov. 14, 2022:    NSR at 77  no ST or T wave changs.    Recent Labs: No results found for requested labs within last 8760 hours.  Recent Lipid Panel No results found for: CHOL, TRIG, HDL, CHOLHDL, VLDL, LDLCALC, LDLDIRECT   Risk Assessment/Calculations:          Physical Exam:    VS:  BP 122/64 (BP Location: Left Arm, Patient Position: Sitting, Cuff Size: Large)   Pulse 77   Ht '5\' 4"'  (1.626 m)   Wt 222 lb (100.7 kg)   SpO2 93%   BMI 38.11 kg/m     Wt Readings from Last 3 Encounters:  03/03/21 222 lb (100.7 kg)     GEN:  Well nourished, well developed in no acute distress HEENT: Normal NECK: No JVD; No carotid bruits LYMPHATICS: No lymphadenopathy CARDIAC: RRR, no murmurs, rubs, gallops RESPIRATORY:  Clear to auscultation without rales, wheezing or rhonchi  ABDOMEN: Soft, non-tender, non-distended MUSCULOSKELETAL:  No edema; No deformity  SKIN: Warm and dry NEUROLOGIC:  Alert and oriented x 3 PSYCHIATRIC:  Normal affect   ASSESSMENT:    1. Carotid atherosclerosis, unspecified laterality   2. Essential hypertension    PLAN:      HTN:   I am not clear as to the dose of her Losartan.   Her med sheet in epic has 3 different doses,  the recent note from [facility] suggests she is on Losartan 25 mg a day ,  the patients med list has Losartan 50 mg a day  My nurse will call [facility] and see if we can get an accurate med list  The good news is that her blood pressure and heart rate are both very well controlled.  She is not having any signs of hypotension.   She is not having any syncope or presyncope.  Her heart rate is normal.  At this point I think that she should  continue all of her blood pressure medicine since that she is currently on.  We will get an accurate list but I do not think she needs any changes of her medications.  2.  History of carotid plaque: She apparently was found to have some carotid plaque in the past.  I do not hear any bruits.  We will get a carotid duplex scan to follow-up with her history of carotid atherosclerosis.  She overall seems to be doing well.  We will see her on an as-needed basis and continue to have her follow-up with her primary medical provider .       Medication Adjustments/Labs and Tests Ordered: Current medicines are reviewed at length with the patient today.  Concerns regarding medicines are outlined above.  Orders Placed This Encounter  Procedures   EKG 12-Lead   VAS US CAROTID   No orders of the defined types were placed in this encounter.   Patient Instructions  Medication Instructions:  Your physician recommends that you continue on your current medications as directed. Please refer to the Current Medication list given to you today.  *If you need a refill on your cardiac medications before your next appointment, please call your pharmacy*   Lab Work: none If you have labs (blood work) drawn today and your tests are completely normal, you will receive your results only by: [facility] (if you have MyChart) OR A paper copy in the mail If you have any lab test that is abnormal or we need to change your treatment, we will call you to review the results.   Testing/Procedures: Your physician has requested that you have a carotid duplex. This test is an ultrasound of the carotid arteries in your neck. It looks at blood flow through these arteries that supply the brain with blood. Allow one hour for this exam. There are no restrictions or special instructions.    Follow-Up: At [facility], you and your health needs are our priority.  As part of our continuing mission to provide you with exceptional heart care, we have created designated Provider Care Teams.  These Care Teams include your primary Cardiologist (physician) and Advanced Practice Providers (APPs -  Physician Assistants and Nurse Practitioners) who all work together to provide you with the care you need, when you need it.  We recommend signing up for the patient portal called "MyChart".  Sign up information is provided on this After Visit Summary.  MyChart is used to connect with patients for Virtual Visits (Telemedicine).  Patients are able to view lab/test results, encounter notes, upcoming appointments, etc.  Non-urgent messages can be sent to your provider as well.   To learn more about what you can do with MyChart, go to [email or web address].    Your next appointment:  As needed    Signed, Mertie Moores, MD  03/03/2021 2:55 PM    [place]

## 2021-03-03 ENCOUNTER — Encounter (INDEPENDENT_AMBULATORY_CARE_PROVIDER_SITE_OTHER): Payer: Self-pay

## 2021-03-03 ENCOUNTER — Other Ambulatory Visit: Payer: Self-pay

## 2021-03-03 ENCOUNTER — Encounter: Payer: Self-pay | Admitting: Cardiovascular Disease

## 2021-03-03 ENCOUNTER — Ambulatory Visit: Payer: Medicare PPO | Admitting: Cardiovascular Disease

## 2021-03-03 ENCOUNTER — Telehealth: Payer: Self-pay

## 2021-03-03 VITALS — BP 122/64 | HR 77 | Ht 64.0 in | Wt 222.0 lb

## 2021-03-03 DIAGNOSIS — I10 Essential (primary) hypertension: Secondary | ICD-10-CM

## 2021-03-03 DIAGNOSIS — I6529 Occlusion and stenosis of unspecified carotid artery: Secondary | ICD-10-CM

## 2021-03-03 NOTE — Patient Instructions (Signed)
Medication Instructions:  Your physician recommends that you continue on your current medications as directed. Please refer to the Current Medication list given to you today.  *If you need a refill on your cardiac medications before your next appointment, please call your pharmacy*   Lab Work: none If you have labs (blood work) drawn today and your tests are completely normal, you will receive your results only by: [place] (if you have MyChart) OR A paper copy in the mail If you have any lab test that is abnormal or we need to change your treatment, we will call you to review the results.   Testing/Procedures: Your physician has requested that you have a carotid duplex. This test is an ultrasound of the carotid arteries in your neck. It looks at blood flow through these arteries that supply the brain with blood. Allow one hour for this exam. There are no restrictions or special instructions.    Follow-Up: At [facility], you and your health needs are our priority.  As part of our continuing mission to provide you with exceptional heart care, we have created designated Provider Care Teams.  These Care Teams include your primary Cardiologist (physician) and Advanced Practice Providers (APPs -  Physician Assistants and Nurse Practitioners) who all work together to provide you with the care you need, when you need it.  We recommend signing up for the patient portal called "MyChart".  Sign up information is provided on this After Visit Summary.  MyChart is used to connect with patients for Virtual Visits (Telemedicine).  Patients are able to view lab/test results, encounter notes, upcoming appointments, etc.  Non-urgent messages can be sent to your provider as well.   To learn more about what you can do with MyChart, go to [email or web address].    Your next appointment:  As needed

## 2021-03-03 NOTE — Telephone Encounter (Signed)
Per [facility], [facility], per [place], the pt updated BP meds..  Losartan 25 mg daily which was reduced about  4 weeks ago from 50 mg  Lasix 20 mg daily   Kcl 20 meq daily   Med list updated.

## 2021-03-10 ENCOUNTER — Ambulatory Visit (HOSPITAL_COMMUNITY)
Admission: RE | Admit: 2021-03-10 | Discharge: 2021-03-10 | Disposition: A | Discharge status: Home / Self Care | Payer: Medicare PPO | Source: Ambulatory Visit | Attending: Cardiology | Admitting: Cardiology

## 2021-03-10 ENCOUNTER — Other Ambulatory Visit: Payer: Self-pay

## 2021-03-10 DIAGNOSIS — I6529 Occlusion and stenosis of unspecified carotid artery: Secondary | ICD-10-CM | POA: Insufficient documentation

## 2021-03-10 DIAGNOSIS — R42 Dizziness and giddiness: Secondary | ICD-10-CM

## 2021-04-24 ENCOUNTER — Ambulatory Visit: Payer: Medicare PPO | Admitting: Sports Medicine

## 2021-05-01 ENCOUNTER — Other Ambulatory Visit: Payer: Self-pay

## 2021-05-01 ENCOUNTER — Ambulatory Visit (INDEPENDENT_AMBULATORY_CARE_PROVIDER_SITE_OTHER): Payer: Medicare PPO | Admitting: Sports Medicine

## 2021-05-01 ENCOUNTER — Encounter: Payer: Self-pay | Admitting: Sports Medicine

## 2021-05-01 DIAGNOSIS — E1140 Type 2 diabetes mellitus with diabetic neuropathy, unspecified: Secondary | ICD-10-CM

## 2021-05-01 DIAGNOSIS — B351 Tinea unguium: Secondary | ICD-10-CM | POA: Diagnosis not present

## 2021-05-01 DIAGNOSIS — M79675 Pain in left toe(s): Secondary | ICD-10-CM | POA: Diagnosis not present

## 2021-05-01 DIAGNOSIS — M79674 Pain in right toe(s): Secondary | ICD-10-CM

## 2021-05-01 NOTE — Progress Notes (Addendum)
Patient ID: Shannon [name], female   DOB: 1940/11/01, 81 y.o.   MRN: [id number] Subjective: Shannon [name] is a 81 y.o. female patient with history of type 2 diabetes who returns to office today complaining of long, painful nails while ambulating in shoes; unable to trim.  Fasting blood sugar 123.  Last A1c 6.1.  Unsure of last PCP visit.  Patient lives at [facility] assisted living facility.  Patient Active Problem List   Diagnosis Date Noted   Acute frontal sinusitis 07/04/2020   Chronic obstructive pulmonary disease ([place]) 07/04/2020   Polyneuropathy due to type 2 diabetes mellitus ([place]) 07/04/2020   Essential hypertension 07/04/2020   Functional urinary incontinence 07/04/2020   Hyperglycemia due to type 2 diabetes mellitus ([place]) 07/04/2020   Mixed hyperlipidemia 07/04/2020   Hypothyroidism 07/04/2020   Irritable bowel syndrome 07/04/2020   Other long term (current) drug therapy 07/04/2020   Other vitamin B12 deficiency anemias 07/04/2020   Peripheral vascular disease ([place]) 07/04/2020   Type 2 diabetes mellitus without complications ([place]) 99/83/3825   Primary generalized (osteo)arthritis 07/04/2020   Vitamin D deficiency 07/04/2020   Depression 10/15/2016   Current Outpatient Medications on File Prior to Visit  Medication Sig Dispense Refill   acetaminophen-codeine (TYLENOL #3) 300-30 MG tablet Take 1 tablet by mouth 2 (two) times daily as needed. (Patient not taking: Reported on 03/03/2021)     aspirin 81 MG EC tablet      azithromycin (ZITHROMAX) 250 MG tablet Take 250 mg by mouth as directed. (Patient not taking: Reported on 03/03/2021)     Black Cohosh 40 MG CAPS 1 tablet in the morning     butalbital-acetaminophen-caffeine (FIORICET WITH CODEINE) 50-325-40-30 MG capsule Take 1 capsule by mouth every 4 (four) hours as needed for headache.     carbamide peroxide (DEBROX) 6.5 % OTIC solution 5 drops 2 (two) times daily.     celecoxib (CELEBREX) 200 MG capsule       cetirizine (ZYRTEC) 10 MG tablet      ciprofloxacin (CIPRO) 250 MG tablet SMARTSIG:1 Tablet(s) By Mouth Every 12 Hours (Patient not taking: Reported on 03/03/2021)     citalopram (CELEXA) 20 MG tablet Take 20 mg by mouth daily.     clotrimazole (LOTRIMIN) 1 % cream Apply 1 application topically 2 (two) times daily.     donepezil (ARICEPT) 5 MG tablet Take 5 mg by mouth daily.     EPINEPHrine (ADRENALIN) 0.1 % nasal solution Place 1 drop into the nose once.     EPINEPHrine 0.3 mg/0.3 mL IJ SOAJ injection SMARTSIG:0.3 Milliliter(s) IM Once PRN     erythromycin ophthalmic ointment SMARTSIG:In Eye(s) (Patient not taking: Reported on 03/03/2021)     fluticasone (FLONASE) 50 MCG/ACT nasal spray      furosemide (LASIX) 20 MG tablet Take 20 mg by mouth.     gabapentin (NEURONTIN) 600 MG tablet      HYDROcodone-acetaminophen (NORCO/VICODIN) 5-325 MG tablet Take 1 tablet by mouth every 6 (six) hours as needed for moderate pain. (Patient not taking: Reported on 03/03/2021)     INVOKANA 300 MG TABS      LANTUS SOLOSTAR 100 UNIT/ML Solostar Pen      lidocaine (LIDODERM) 5 %  (Patient not taking: Reported on 03/03/2021)     LORazepam (ATIVAN) 1 MG tablet Take 1 mg by mouth every 8 (eight) hours.     losartan (COZAAR) 25 MG tablet Take 25 mg by mouth daily.     lubiprostone (AMITIZA) 24 MCG  capsule 1 capsule with food     metFORMIN (GLUCOPHAGE) 500 MG tablet      MYRBETRIQ 25 MG TB24 tablet 50 mg.      nitrofurantoin, macrocrystal-monohydrate, (MACROBID) 100 MG capsule Take 100 mg by mouth 2 (two) times daily. (Patient not taking: Reported on 03/03/2021)     NOVOFINE 32G X 6 MM MISC      nystatin (MYCOSTATIN/NYSTOP) 100000 UNIT/GM POWD  (Patient not taking: Reported on 03/03/2021)     omeprazole (PRILOSEC) 20 MG capsule      ONGLYZA 5 MG TABS tablet      pioglitazone-metformin (ACTOPLUS MET) 15-500 MG tablet Take 1 tablet by mouth 2 (two) times daily.     Polyethyl Glycol-Propyl Glycol (SYSTANE) 0.4-0.3 %  GEL ophthalmic gel Place 1 application into both eyes. (Patient not taking: Reported on 03/03/2021)     potassium chloride 20 MEQ/100ML IVPB 1 packet with food     Prodigy Lancets 28G MISC      Skin Protectants, Misc. (MINERIN) CREA Apply topically.     sulfamethoxazole-trimethoprim (BACTRIM) 400-80 MG tablet Take 1 tablet by mouth 2 (two) times daily. (Patient not taking: Reported on 03/03/2021) 28 tablet 0   traMADol (ULTRAM-ER) 200 MG 24 hr tablet  (Patient not taking: Reported on 03/03/2021)     traZODone (DESYREL) 100 MG tablet Take 100 mg by mouth at bedtime.     triamcinolone cream (KENALOG) 0.1 % Apply 1 application topically 2 (two) times daily.     Wound Dressings (MEDIHONEY WOUND/BURN DRESSING) GEL Apply to left toe ulceration 3x per week 15 mL 1   No current facility-administered medications on file prior to visit.   Allergies  Allergen Reactions   Peanut Oil Anaphylaxis   Demerol  [Meperidine Hcl] Nausea And Vomiting   Other Hives    Takes skin off   Demerol [Meperidine]    Peanut Butter Flavor    Tape Dermatitis   Objective: General: Patient is awake, alert, and oriented x 3 and in no acute distress.  Integument: Skin is warm, dry and supple bilateral. Nails are tender, long, thickened and dystrophic with subungual debris, consistent with onychomycosis, 1-5 bilateral.  Previous wounds remains healed. Old surgical scars Remaining integument unremarkable.  Vasculature:  Dorsalis Pedis pulse1/4 bilateral. Posterior Tibial pulse 1 /4 bilateral. Capillary fill time <4 sec 1-5 bilateral. Scant hair growth to the level of the digits.Temperature gradient within normal limits.  + varicosities present bilateral. No edema present bilateral.   Neurology: Gross sensation present via light touch.  Protective sensation however is severely diminished bilateral.  Musculoskeletal: Bunion with crossover and hammertoes deformities noted bilateral, midfoot exostosis on right with bilateral  pes planus. Muscular strength 5/5 in all lower extremity muscular groups bilateral without pain or limitation on range of motion . No tenderness with calf compression bilateral.  Assessment and Plan: Problem List Items Addressed This Visit   None Visit Diagnoses     Pain due to onychomycosis of toenails of both feet    -  Primary   Type 2 diabetes, controlled, with neuropathy ([place])          -Examined patient. -Re-Discussed and educated patient on diabetic foot care, especially with  regards to the vascular, neurological and musculoskeletal systems.  -Mechanically debrided all painful nails 1-5 bilateral using sterile nail nipper in girth and length and filed with dremel without incident. -Answered all patient questions -Continue with extra-depth diabetic shoes -Patient to return in 2.5 months for at risk foot care -  Patient advised to call the office if any problems or questions arise in the meantime.  Landis Martins, DPM

## 2021-06-30 ENCOUNTER — Encounter (HOSPITAL_COMMUNITY): Payer: Self-pay

## 2021-06-30 ENCOUNTER — Emergency Department (HOSPITAL_COMMUNITY): Payer: Medicare PPO

## 2021-06-30 ENCOUNTER — Emergency Department (HOSPITAL_COMMUNITY)
Admission: EM | Admit: 2021-06-30 | Discharge: 2021-07-01 | Disposition: A | Discharge status: Skilled Nursing Facility | Payer: Medicare PPO | Attending: Student | Admitting: Student

## 2021-06-30 ENCOUNTER — Other Ambulatory Visit: Payer: Self-pay

## 2021-06-30 DIAGNOSIS — R079 Chest pain, unspecified: Secondary | ICD-10-CM | POA: Insufficient documentation

## 2021-06-30 DIAGNOSIS — E1165 Type 2 diabetes mellitus with hyperglycemia: Secondary | ICD-10-CM | POA: Insufficient documentation

## 2021-06-30 DIAGNOSIS — M25551 Pain in right hip: Secondary | ICD-10-CM | POA: Insufficient documentation

## 2021-06-30 DIAGNOSIS — R600 Localized edema: Secondary | ICD-10-CM | POA: Insufficient documentation

## 2021-06-30 DIAGNOSIS — W01198A Fall on same level from slipping, tripping and stumbling with subsequent striking against other object, initial encounter: Secondary | ICD-10-CM | POA: Insufficient documentation

## 2021-06-30 DIAGNOSIS — M542 Cervicalgia: Secondary | ICD-10-CM | POA: Insufficient documentation

## 2021-06-30 DIAGNOSIS — I10 Essential (primary) hypertension: Secondary | ICD-10-CM | POA: Insufficient documentation

## 2021-06-30 DIAGNOSIS — Z7982 Long term (current) use of aspirin: Secondary | ICD-10-CM | POA: Insufficient documentation

## 2021-06-30 DIAGNOSIS — E039 Hypothyroidism, unspecified: Secondary | ICD-10-CM | POA: Diagnosis not present

## 2021-06-30 DIAGNOSIS — F0390 Unspecified dementia without behavioral disturbance: Secondary | ICD-10-CM | POA: Diagnosis not present

## 2021-06-30 DIAGNOSIS — W19XXXA Unspecified fall, initial encounter: Secondary | ICD-10-CM

## 2021-06-30 LAB — COMPREHENSIVE METABOLIC PANEL
ALT: 15 U/L (ref 0–44)
AST: 26 U/L (ref 15–41)
Albumin: 3.7 g/dL (ref 3.5–5.0)
Alkaline Phosphatase: 67 U/L (ref 38–126)
Anion gap: 10 (ref 5–15)
BUN: 16 mg/dL (ref 8–23)
CO2: 24 mmol/L (ref 22–32)
Calcium: 10.4 mg/dL — ABNORMAL HIGH (ref 8.9–10.3)
Chloride: 106 mmol/L (ref 98–111)
Creatinine, Ser: 0.87 mg/dL (ref 0.44–1.00)
GFR, Estimated: >60 mL/min (ref >60)
Glucose, Bld: 95 mg/dL (ref 70–99)
Potassium: 4.5 mmol/L (ref 3.5–5.1)
Sodium: 140 mmol/L (ref 135–145)
Total Bilirubin: 0.9 mg/dL (ref 0.3–1.2)
Total Protein: 6.1 g/dL — ABNORMAL LOW (ref 6.5–8.1)

## 2021-06-30 LAB — CBG MONITORING, ED: Glucose-Capillary: 108 mg/dL — ABNORMAL HIGH (ref 70–99)

## 2021-06-30 LAB — CBC WITH DIFFERENTIAL/PLATELET
Abs Immature Granulocytes: 0.03 10*3/uL (ref 0.00–0.07)
Basophils Absolute: 0 10*3/uL (ref 0.0–0.1)
Basophils Relative: 1 %
Eosinophils Absolute: 0.3 10*3/uL (ref 0.0–0.5)
Eosinophils Relative: 3 %
HCT: 46 % (ref 36.0–46.0)
Hemoglobin: 14.8 g/dL (ref 12.0–15.0)
Immature Granulocytes: 0 %
Lymphocytes Relative: 29 %
Lymphs Abs: 2.4 10*3/uL (ref 0.7–4.0)
MCH: 29 pg (ref 26.0–34.0)
MCHC: 32.2 g/dL (ref 30.0–36.0)
MCV: 90.2 fL (ref 80.0–100.0)
Monocytes Absolute: 0.5 10*3/uL (ref 0.1–1.0)
Monocytes Relative: 6 %
Neutro Abs: 5 10*3/uL (ref 1.7–7.7)
Neutrophils Relative %: 61 %
Platelets: 186 10*3/uL (ref 150–400)
RBC: 5.1 MIL/uL (ref 3.87–5.11)
RDW: 12.7 % (ref 11.5–15.5)
WBC: 8.1 10*3/uL (ref 4.0–10.5)
nRBC: 0 % (ref 0.0–0.2)

## 2021-06-30 MED ORDER — HYDROCODONE-ACETAMINOPHEN 5-325 MG PO TABS
1.0000 | ORAL_TABLET | Freq: Once | ORAL | Status: AC
  Administered 2021-06-30: 1 via ORAL
  Filled 2021-06-30: qty 1

## 2021-06-30 MED ORDER — LIDOCAINE 5 % EX PTCH
1.0000 | MEDICATED_PATCH | CUTANEOUS | Status: DC
  Administered 2021-06-30: 1 via TRANSDERMAL
  Filled 2021-06-30: qty 1

## 2021-06-30 NOTE — Discharge Instructions (Addendum)
You were evaluated in the Emergency Department and after careful evaluation, we did not find any emergent condition requiring admission or further testing in the hospital. ? ?Your exam/testing today was overall reassuring.  Please consider taking Tylenol and Motrin as needed for pain control.  If you are unable to ambulate or start having weakness, or numbness and tingling of any of your extremities please return to the ED. ? 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  Recommending outpatient follow-up. ? ? ? ?Please return to the Emergency Department if you experience any worsening of your condition.  Thank you for allowing [place] to be a part of your care.  ?

## 2021-06-30 NOTE — ED Provider Notes (Incomplete)
[facility] EMERGENCY DEPARTMENT Provider Note   CSN: [id number] Arrival date & time: 06/30/21  1849     History {Add pertinent medical, surgical, social history, OB history to HPI:1} Chief Complaint  Patient presents with   Fall   Hip Pain    Shannon [name] is a 81 y.o. female.   Fall  Hip Pain      Home Medications Prior to Admission medications   Medication Sig Start Date End Date Taking? Authorizing Provider  acetaminophen-codeine (TYLENOL #3) 300-30 MG tablet Take 1 tablet by mouth 2 (two) times daily as needed. Patient not taking: Reported on 03/03/2021 07/21/19   [provider]  aspirin 81 MG EC tablet     [provider]  azithromycin (ZITHROMAX) 250 MG tablet Take 250 mg by mouth as directed. Patient not taking: Reported on 03/03/2021 06/12/19   [provider]  Black Cohosh 40 MG CAPS 1 tablet in the morning    [provider]  butalbital-acetaminophen-caffeine (FIORICET WITH CODEINE) 50-325-40-30 MG capsule Take 1 capsule by mouth every 4 (four) hours as needed for headache.    [provider]  carbamide peroxide (DEBROX) 6.5 % OTIC solution 5 drops 2 (two) times daily.    [provider]  celecoxib (CELEBREX) 200 MG capsule     [provider]  cetirizine (ZYRTEC) 10 MG tablet     [provider]  ciprofloxacin (CIPRO) 250 MG tablet SMARTSIG:1 Tablet(s) By Mouth Every 12 Hours Patient not taking: Reported on 03/03/2021 11/28/19   [provider]  citalopram (CELEXA) 20 MG tablet Take 20 mg by mouth daily.    [provider]  clotrimazole (LOTRIMIN) 1 % cream Apply 1 application topically 2 (two) times daily.    [provider]  donepezil (ARICEPT) 5 MG tablet Take 5 mg by mouth daily. 03/07/19   [provider]  EPINEPHrine (ADRENALIN) 0.1 % nasal solution Place 1 drop into the nose once.    [provider]  EPINEPHrine 0.3  mg/0.3 mL IJ SOAJ injection SMARTSIG:0.3 Milliliter(s) IM Once PRN 07/21/19   [provider]  erythromycin ophthalmic ointment SMARTSIG:In Eye(s) Patient not taking: Reported on 03/03/2021 06/27/20   [provider]  fluticasone Asencion Islam) 50 MCG/ACT nasal spray  07/24/19   [provider]  furosemide (LASIX) 20 MG tablet Take 20 mg by mouth.    [provider]  gabapentin (NEURONTIN) 600 MG tablet     [provider]  HYDROcodone-acetaminophen (NORCO/VICODIN) 5-325 MG tablet Take 1 tablet by mouth every 6 (six) hours as needed for moderate pain. Patient not taking: Reported on 03/03/2021    [provider]  [facility] 300 MG TABS  12/21/13   [provider]  LANTUS SOLOSTAR 100 UNIT/ML Solostar Pen  08/16/14   [provider]  lidocaine (LIDODERM) 5 %  05/03/18   [provider]  LORazepam (ATIVAN) 1 MG tablet Take 1 mg by mouth every 8 (eight) hours.    [provider]  losartan (COZAAR) 25 MG tablet Take 25 mg by mouth daily. 01/01/21   [provider]  lubiprostone (AMITIZA) 24 MCG capsule 1 capsule with food    [provider]  metFORMIN (GLUCOPHAGE) 500 MG tablet     [provider]  MYRBETRIQ 25 MG TB24 tablet 50 mg.  12/12/13   [provider]  nitrofurantoin, macrocrystal-monohydrate, (MACROBID) 100 MG capsule Take 100 mg by mouth 2 (two) times daily. Patient not taking: Reported  on 03/03/2021 01/12/20   [provider]  NOVOFINE 32G X 6 MM MISC  07/03/19   [provider]  nystatin (MYCOSTATIN/NYSTOP) 100000 UNIT/GM POWD  08/20/14   [provider]  omeprazole (PRILOSEC) 20 MG capsule     [provider]  ONGLYZA 5 MG TABS tablet  12/21/13   [provider]  pioglitazone-metformin (ACTOPLUS MET) 15-500 MG tablet Take 1 tablet by mouth 2 (two) times daily. 01/03/20   [provider]  Polyethyl Glycol-Propyl Glycol (SYSTANE)  0.4-0.3 % GEL ophthalmic gel Place 1 application into both eyes. Patient not taking: Reported on 03/03/2021    [provider]  potassium chloride 20 MEQ/100ML IVPB 1 packet with food    [provider]  Prodigy Lancets 28G [place]  08/08/19   [provider]  Skin Protectants, Misc. (MINERIN) CREA Apply topically.    [provider]  sulfamethoxazole-trimethoprim (BACTRIM) 400-80 MG tablet Take 1 tablet by mouth 2 (two) times daily. Patient not taking: Reported on 03/03/2021 07/04/20   Landis Martins, DPM  traMADol (ULTRAM-ER) 200 MG 24 hr tablet  05/31/18   [provider]  traZODone (DESYREL) 100 MG tablet Take 100 mg by mouth at bedtime. 03/07/19   [provider]  triamcinolone cream (KENALOG) 0.1 % Apply 1 application topically 2 (two) times daily.    [provider]  Wound Dressings (MEDIHONEY WOUND/BURN DRESSING) GEL Apply to left toe ulceration 3x per week 05/04/18   Landis Martins, DPM      Allergies    Peanut oil, Demerol  [meperidine hcl], Other, Demerol [meperidine], Peanut butter flavor, and Tape    Review of Systems   Review of Systems  Physical Exam Updated Vital Signs Ht '5\' 4"'  (1.626 m)    Wt 99.8 kg    SpO2 97%    BMI 37.76 kg/m  Physical Exam  ED Results / Procedures / Treatments   Labs (all labs ordered are listed, but only abnormal results are displayed) Labs Reviewed - No data to display  EKG None  Radiology No results found.  Procedures Procedures  {Document cardiac monitor, telemetry assessment procedure when appropriate:1}  Medications Ordered in ED Medications - No data to display  ED Course/ Medical Decision Making/ A&P                           Medical Decision Making  ***  {Document critical care time when appropriate:1} {Document review of labs and clinical decision tools ie heart score, Chads2Vasc2 etc:1}  {Document your independent review of radiology images, and any outside  records:1} {Document your discussion with family members, caretakers, and with consultants:1} {Document social determinants of health affecting pt's care:1} {Document your decision making why or why not admission, treatments were needed:1} Final Clinical Impression(s) / ED Diagnoses Final diagnoses:  None    Rx / DC Orders ED Discharge Orders     None

## 2021-06-30 NOTE — ED Triage Notes (Signed)
Pt arrived to ED via EMS from [facility] when she lost her balance and had a mechanical fall w/ impact to R hip. Pt c/o R ankle pain, head pain, R hip pain. Pt reports frequent falls. VSS w/ EMS. EMS reports pt is able to bair weight. ?

## 2021-07-10 ENCOUNTER — Ambulatory Visit (INDEPENDENT_AMBULATORY_CARE_PROVIDER_SITE_OTHER): Payer: Medicare PPO | Admitting: Sports Medicine

## 2021-07-10 ENCOUNTER — Encounter: Payer: Self-pay | Admitting: Sports Medicine

## 2021-07-10 ENCOUNTER — Other Ambulatory Visit: Payer: Self-pay

## 2021-07-10 DIAGNOSIS — M79675 Pain in left toe(s): Secondary | ICD-10-CM | POA: Diagnosis not present

## 2021-07-10 DIAGNOSIS — B351 Tinea unguium: Secondary | ICD-10-CM | POA: Diagnosis not present

## 2021-07-10 DIAGNOSIS — M79674 Pain in right toe(s): Secondary | ICD-10-CM | POA: Diagnosis not present

## 2021-07-10 DIAGNOSIS — E1140 Type 2 diabetes mellitus with diabetic neuropathy, unspecified: Secondary | ICD-10-CM

## 2021-07-10 NOTE — Progress Notes (Signed)
Patient ID: Shannon [name], female   DOB: 20-Oct-1940, 81 y.o.   MRN: [id number] ?Subjective: ?Shannon [name] is a 81 y.o. female patient with history of type 2 diabetes who returns to office today complaining of long, painful nails while ambulating in shoes; unable to trim. ? ?Had a fall and hurt her hip.  ? ?Patient lives at [facility] assisted living facility. ? ?Patient Active Problem List  ? Diagnosis Date Noted  ? Acute frontal sinusitis 07/04/2020  ? Chronic obstructive pulmonary disease ([place]) 07/04/2020  ? Polyneuropathy due to type 2 diabetes mellitus ([place]) 07/04/2020  ? Essential hypertension 07/04/2020  ? Functional urinary incontinence 07/04/2020  ? Hyperglycemia due to type 2 diabetes mellitus ([place]) 07/04/2020  ? Mixed hyperlipidemia 07/04/2020  ? Hypothyroidism 07/04/2020  ? Irritable bowel syndrome 07/04/2020  ? Other long term (current) drug therapy 07/04/2020  ? Other vitamin B12 deficiency anemias 07/04/2020  ? Peripheral vascular disease ([place]) 07/04/2020  ? Type 2 diabetes mellitus without complications ([place]) 22/29/7989  ? Primary generalized (osteo)arthritis 07/04/2020  ? Vitamin D deficiency 07/04/2020  ? Depression 10/15/2016  ? ?Current Outpatient Medications on File Prior to Visit  ?Medication Sig Dispense Refill  ? acetaminophen-codeine (TYLENOL #3) 300-30 MG tablet Take 1 tablet by mouth 2 (two) times daily as needed. (Patient not taking: Reported on 03/03/2021)    ? aspirin 81 MG EC tablet     ? azithromycin (ZITHROMAX) 250 MG tablet Take 250 mg by mouth as directed. (Patient not taking: Reported on 03/03/2021)    ? Black Cohosh 40 MG CAPS 1 tablet in the morning    ? butalbital-acetaminophen-caffeine (FIORICET WITH CODEINE) 50-325-40-30 MG capsule Take 1 capsule by mouth every 4 (four) hours as needed for headache.    ? carbamide peroxide (DEBROX) 6.5 % OTIC solution 5 drops 2 (two) times daily.    ? celecoxib (CELEBREX) 200 MG capsule     ? cetirizine (ZYRTEC) 10 MG tablet     ?  ciprofloxacin (CIPRO) 250 MG tablet SMARTSIG:1 Tablet(s) By Mouth Every 12 Hours (Patient not taking: Reported on 03/03/2021)    ? citalopram (CELEXA) 20 MG tablet Take 20 mg by mouth daily.    ? clotrimazole (LOTRIMIN) 1 % cream Apply 1 application topically 2 (two) times daily.    ? donepezil (ARICEPT) 5 MG tablet Take 5 mg by mouth daily.    ? EPINEPHrine (ADRENALIN) 0.1 % nasal solution Place 1 drop into the nose once.    ? EPINEPHrine 0.3 mg/0.3 mL IJ SOAJ injection SMARTSIG:0.3 Milliliter(s) IM Once PRN    ? erythromycin ophthalmic ointment SMARTSIG:In Eye(s) (Patient not taking: Reported on 03/03/2021)    ? fluticasone (FLONASE) 50 MCG/ACT nasal spray     ? furosemide (LASIX) 20 MG tablet Take 20 mg by mouth.    ? gabapentin (NEURONTIN) 600 MG tablet     ? HYDROcodone-acetaminophen (NORCO/VICODIN) 5-325 MG tablet Take 1 tablet by mouth every 6 (six) hours as needed for moderate pain. (Patient not taking: Reported on 03/03/2021)    ? INVOKANA 300 MG TABS     ? LANTUS SOLOSTAR 100 UNIT/ML Solostar Pen     ? lidocaine (LIDODERM) 5 %  (Patient not taking: Reported on 03/03/2021)    ? LORazepam (ATIVAN) 1 MG tablet Take 1 mg by mouth every 8 (eight) hours.    ? losartan (COZAAR) 25 MG tablet Take 25 mg by mouth daily.    ? lubiprostone (AMITIZA) 24 MCG capsule 1 capsule with food    ?  metFORMIN (GLUCOPHAGE) 500 MG tablet     ? MYRBETRIQ 25 MG TB24 tablet 50 mg.     ? nitrofurantoin, macrocrystal-monohydrate, (MACROBID) 100 MG capsule Take 100 mg by mouth 2 (two) times daily. (Patient not taking: Reported on 03/03/2021)    ? NOVOFINE 32G X 6 MM MISC     ? nystatin (MYCOSTATIN/NYSTOP) 100000 UNIT/GM POWD  (Patient not taking: Reported on 03/03/2021)    ? omeprazole (PRILOSEC) 20 MG capsule     ? ONGLYZA 5 MG TABS tablet     ? pioglitazone-metformin (ACTOPLUS MET) 15-500 MG tablet Take 1 tablet by mouth 2 (two) times daily.    ? Polyethyl Glycol-Propyl Glycol (SYSTANE) 0.4-0.3 % GEL ophthalmic gel Place 1 application  into both eyes. (Patient not taking: Reported on 03/03/2021)    ? potassium chloride 20 MEQ/100ML IVPB 1 packet with food    ? Prodigy Lancets 28G MISC     ? Skin Protectants, Misc. (MINERIN) CREA Apply topically.    ? sulfamethoxazole-trimethoprim (BACTRIM) 400-80 MG tablet Take 1 tablet by mouth 2 (two) times daily. (Patient not taking: Reported on 03/03/2021) 28 tablet 0  ? traMADol (ULTRAM-ER) 200 MG 24 hr tablet  (Patient not taking: Reported on 03/03/2021)    ? traZODone (DESYREL) 100 MG tablet Take 100 mg by mouth at bedtime.    ? triamcinolone cream (KENALOG) 0.1 % Apply 1 application topically 2 (two) times daily.    ? Wound Dressings (MEDIHONEY WOUND/BURN DRESSING) GEL Apply to left toe ulceration 3x per week 15 mL 1  ? ?No current facility-administered medications on file prior to visit.  ? ?Allergies  ?Allergen Reactions  ? Peanut Oil Anaphylaxis  ? Demerol  [Meperidine Hcl] Nausea And Vomiting  ? Other Hives  ?  Takes skin off  ? Demerol [Meperidine]   ? Peanut Butter Flavor   ? Tape Dermatitis  ? ?Objective: ?General: Patient is awake, alert, and oriented x 3 and in no acute distress. ? ?Integument: Skin is warm, dry and supple bilateral. Nails are tender, long, thickened and dystrophic with subungual debris, consistent with onychomycosis, 1-5 bilateral.  Previous wounds remains healed. Old surgical scars Remaining integument unremarkable. ? ?Vasculature:  Dorsalis Pedis pulse1/4 bilateral. Posterior Tibial pulse 1 /4 bilateral. Capillary fill time <4 sec 1-5 bilateral. Scant hair growth to the level of the digits.Temperature gradient within normal limits.  + varicosities present bilateral. No edema present bilateral.  ? ?Neurology: Gross sensation present via light touch.  Protective sensation however is severely diminished bilateral. ? ?Musculoskeletal: Bunion with crossover and hammertoes deformities noted bilateral, midfoot exostosis on right with bilateral pes planus. Muscular strength 5/5 in all  lower extremity muscular groups bilateral without pain or limitation on range of motion . No tenderness with calf compression bilateral. ? ?Assessment and Plan: ?Problem List Items Addressed This Visit   ?None ?Visit Diagnoses   ? ? Pain due to onychomycosis of toenails of both feet    -  Primary  ? Type 2 diabetes, controlled, with neuropathy ([place])      ? ?  ? ?-Examined patient. ?-Re-Discussed and educated patient on diabetic foot care, especially with  ?regards to the vascular, neurological and musculoskeletal systems.  ?-Mechanically debrided all painful nails 1-5 bilateral using sterile nail nipper in girth and length and filed with dremel without incident. ?-Cont with Diabetic shoes  ?-Continue with cane to prevent falls  ?-Patient to return in 2.5 months for at risk foot care with Dr. Elisha Ponder  ?-Patient advised  to call the office if any problems or questions arise in the meantime. ? ?Landis Martins, DPM ? ?

## 2021-10-15 ENCOUNTER — Encounter: Payer: Self-pay | Admitting: Podiatry

## 2021-10-15 ENCOUNTER — Ambulatory Visit (INDEPENDENT_AMBULATORY_CARE_PROVIDER_SITE_OTHER): Payer: Medicare PPO | Admitting: Podiatry

## 2021-10-15 DIAGNOSIS — M79674 Pain in right toe(s): Secondary | ICD-10-CM | POA: Diagnosis not present

## 2021-10-15 DIAGNOSIS — E1140 Type 2 diabetes mellitus with diabetic neuropathy, unspecified: Secondary | ICD-10-CM | POA: Diagnosis not present

## 2021-10-15 DIAGNOSIS — E11621 Type 2 diabetes mellitus with foot ulcer: Secondary | ICD-10-CM | POA: Diagnosis not present

## 2021-10-15 DIAGNOSIS — B351 Tinea unguium: Secondary | ICD-10-CM | POA: Diagnosis not present

## 2021-10-15 DIAGNOSIS — M79675 Pain in left toe(s): Secondary | ICD-10-CM

## 2021-10-15 DIAGNOSIS — M2040 Other hammer toe(s) (acquired), unspecified foot: Secondary | ICD-10-CM | POA: Diagnosis not present

## 2021-10-15 DIAGNOSIS — E119 Type 2 diabetes mellitus without complications: Secondary | ICD-10-CM

## 2021-10-15 DIAGNOSIS — L97511 Non-pressure chronic ulcer of other part of right foot limited to breakdown of skin: Secondary | ICD-10-CM

## 2021-10-15 MED ORDER — SILVER SULFADIAZINE 1 % EX CREA: TOPICAL_CREAM | CUTANEOUS | 0 refills | Status: AC

## 2021-10-15 NOTE — Patient Instructions (Signed)
FOLLOW UP WITH DR. Blenda Mounts IN 2 WEEKS FOR RIGHT FOOT ULCER.  DRESSING CHANGES RIGHT FOOT:   PHARMACY SHOPPING LIST: Saline or Wound Cleanser for cleaning wound 2 x 2 inch sterile gauze for cleaning wound SILVADENE CREAM  A. IF DISPENSED, WEAR SURGICAL SHOE OR WALKING BOOT AT ALL TIMES.  B. IF PRESCRIBED ORAL ANTIBIOTICS, TAKE ALL MEDICATION AS PRESCRIBED UNTIL ALL ARE GONE.  C. IF DOCTOR HAS DESIGNATED NONWEIGHTBEARING STATUS, PLEASE ADHERE TO INSTRUCTIONS.  1. KEEP RIGHT FOOT DRY AT ALL TIMES!!!!  2. CLEANSE ULCER WITH SALINE OR WOUND CLEANSER.  3. DAB DRY WITH GAUZE SPONGE.  4. APPLY A LIGHT AMOUNT OF SILVADENE TO BASE OF ULCER.  5. APPLY OUTER DRESSING AS INSTRUCTED.  6. WEAR SURGICAL SHOE/BOOT DAILY AT ALL TIMES. IF SUPPLIED, WEAR HEEL PROTECTORS AT ALL TIMES WHEN IN BED.  7. DO NOT WALK BAREFOOT!!!  8.  IF YOU EXPERIENCE ANY FEVER, CHILLS, NIGHTSWEATS, NAUSEA OR VOMITING, ELEVATED OR LOW BLOOD SUGARS, REPORT TO EMERGENCY ROOM.  9. IF YOU EXPERIENCE INCREASED REDNESS, PAIN, SWELLING, DISCOLORATION, ODOR, PUS, DRAINAGE OR WARMTH OF YOUR FOOT, REPORT TO EMERGENCY ROOM.

## 2021-10-16 NOTE — Progress Notes (Signed)
ANNUAL DIABETIC FOOT EXAM  Subjective: Shannon [name] presents today for annual diabetic foot examination, at risk foot care with history of diabetic neuropathy, and painful elongated mycotic toenails 1-5 bilaterally which are tender when wearing enclosed shoe gear. Pain is relieved with periodic professional debridement..  Patient relates 20 year h/o diabetes.  Patient denies any h/o foot wounds.  Patient has h/o foot ulcers which healed via help of local wound care and surgery.  Patient has been diagnosed with neuropathy and it is managed with gabapentin.  Patient's blood sugar was 113 mg/dl today. Last known  HgA1c was 5.9%   Risk factors: diabetes, diabetic neuropathy, history of foot/leg ulcer, h/o CVA, HTN, hyperlipidemia.  Roetta Sessions, NP is patient's PCP. Last visit was October 09, 2021.  Patient states she wore a pair of shoes last week which got wet and caused rubbing on her right bunion area. She states she think a blister developed and is not sure if she has a wound. She has been keeping area clean and has been applying Iodosorb Gel left over from a previous ulcer.  Past Medical History:  Diagnosis Date   Allergy    Anxiety    Arthritis    Cancer ([place])    Cataract    Dementia ([place])    Dementia ([place])    Depression    Diabetes mellitus without complication ([place])    DM (diabetes mellitus) ([place])    GERD (gastroesophageal reflux disease)    Hyperlipidemia    Hypertension    Hypokalemia    Osteoarthritis    Stroke [facility])    Ulcer    Patient Active Problem List   Diagnosis Date Noted   Acute frontal sinusitis 07/04/2020   Chronic obstructive pulmonary disease ([place]) 07/04/2020   Polyneuropathy due to type 2 diabetes mellitus ([place]) 07/04/2020   Essential hypertension 07/04/2020   Functional urinary incontinence 07/04/2020   Hyperglycemia due to type 2 diabetes mellitus ([place]) 07/04/2020   Mixed hyperlipidemia 07/04/2020   Hypothyroidism 07/04/2020    Irritable bowel syndrome 07/04/2020   Other long term (current) drug therapy 07/04/2020   Other vitamin B12 deficiency anemias 07/04/2020   Peripheral vascular disease ([place]) 07/04/2020   Type 2 diabetes mellitus without complications ([place]) 87/86/7672   Primary generalized (osteo)arthritis 07/04/2020   Vitamin D deficiency 07/04/2020   Depression 10/15/2016   Past Surgical History:  Procedure Laterality Date   JOINT REPLACEMENT Bilateral    Current Outpatient Medications on File Prior to Visit  Medication Sig Dispense Refill   acetaminophen-codeine (TYLENOL #3) 300-30 MG tablet Take 1 tablet by mouth 2 (two) times daily as needed. (Patient not taking: Reported on 03/03/2021)     aspirin 81 MG EC tablet      azithromycin (ZITHROMAX) 250 MG tablet Take 250 mg by mouth as directed. (Patient not taking: Reported on 03/03/2021)     Black Cohosh 40 MG CAPS 1 tablet in the morning     butalbital-acetaminophen-caffeine (FIORICET WITH CODEINE) 50-325-40-30 MG capsule Take 1 capsule by mouth every 4 (four) hours as needed for headache.     carbamide peroxide (DEBROX) 6.5 % OTIC solution 5 drops 2 (two) times daily.     celecoxib (CELEBREX) 200 MG capsule      cetirizine (ZYRTEC) 10 MG tablet      citalopram (CELEXA) 20 MG tablet Take 20 mg by mouth daily.     clotrimazole (LOTRIMIN) 1 % cream Apply 1 application topically 2 (two) times daily.  donepezil (ARICEPT) 5 MG tablet Take 5 mg by mouth daily.     EPINEPHrine (ADRENALIN) 0.1 % nasal solution Place 1 drop into the nose once.     EPINEPHrine 0.3 mg/0.3 mL IJ SOAJ injection SMARTSIG:0.3 Milliliter(s) IM Once PRN     erythromycin ophthalmic ointment SMARTSIG:In Eye(s) (Patient not taking: Reported on 03/03/2021)     fluticasone (FLONASE) 50 MCG/ACT nasal spray      furosemide (LASIX) 20 MG tablet Take 20 mg by mouth.     gabapentin (NEURONTIN) 600 MG tablet      HYDROcodone-acetaminophen (NORCO/VICODIN) 5-325 MG tablet Take 1 tablet by  mouth every 6 (six) hours as needed for moderate pain. (Patient not taking: Reported on 03/03/2021)     INVOKANA 300 MG TABS      LANTUS SOLOSTAR 100 UNIT/ML Solostar Pen      lidocaine (LIDODERM) 5 %  (Patient not taking: Reported on 03/03/2021)     LORazepam (ATIVAN) 1 MG tablet Take 1 mg by mouth every 8 (eight) hours.     losartan (COZAAR) 25 MG tablet Take 25 mg by mouth daily.     lubiprostone (AMITIZA) 24 MCG capsule 1 capsule with food     metFORMIN (GLUCOPHAGE) 500 MG tablet      MIRALAX 17 GM/SCOOP powder SMARTSIG:17 Gram(s) By Mouth Every Morning     MUCINEX 600 MG 12 hr tablet Take 600 mg by mouth 2 (two) times daily.     MYRBETRIQ 25 MG TB24 tablet 50 mg.      nitrofurantoin, macrocrystal-monohydrate, (MACROBID) 100 MG capsule Take 100 mg by mouth 2 (two) times daily. (Patient not taking: Reported on 03/03/2021)     NOVOFINE 32G X 6 MM MISC      nystatin (MYCOSTATIN/NYSTOP) 100000 UNIT/GM POWD  (Patient not taking: Reported on 03/03/2021)     omeprazole (PRILOSEC) 20 MG capsule      ONGLYZA 5 MG TABS tablet      pioglitazone-metformin (ACTOPLUS MET) 15-500 MG tablet Take 1 tablet by mouth 2 (two) times daily.     Polyethyl Glycol-Propyl Glycol (SYSTANE) 0.4-0.3 % GEL ophthalmic gel Place 1 application into both eyes. (Patient not taking: Reported on 03/03/2021)     potassium chloride 20 MEQ/100ML IVPB 1 packet with food     potassium chloride SA (KLOR-CON M) 20 MEQ tablet Take 20 mEq by mouth daily.     Prodigy Lancets 28G MISC      Skin Protectants, Misc. (MINERIN) CREA Apply topically.     sulfamethoxazole-trimethoprim (BACTRIM) 400-80 MG tablet Take 1 tablet by mouth 2 (two) times daily. (Patient not taking: Reported on 03/03/2021) 28 tablet 0   traMADol (ULTRAM-ER) 200 MG 24 hr tablet  (Patient not taking: Reported on 03/03/2021)     TraMADol HCl 200 MG TB24 Take 1 tablet by mouth daily.     traZODone (DESYREL) 100 MG tablet Take 100 mg by mouth at bedtime.      triamcinolone cream (KENALOG) 0.1 % Apply 1 application topically 2 (two) times daily.     Vitamin D, Ergocalciferol, 50000 units CAPS Take 1 capsule by mouth once a week.     Wound Dressings (MEDIHONEY WOUND/BURN DRESSING) GEL Apply to left toe ulceration 3x per week 15 mL 1   No current facility-administered medications on file prior to visit.    Allergies  Allergen Reactions   Peanut Oil Anaphylaxis   Demerol  [Meperidine Hcl] Nausea And Vomiting   Other Hives    Takes skin off  Demerol [Meperidine]    Peanut Butter Flavor    Tape Dermatitis   Social History   Occupational History   Not on file  Tobacco Use   Smoking status: Never   Smokeless tobacco: Never  Substance and Sexual Activity   Alcohol use: No   Drug use: No   Sexual activity: Not on file   Family History  Problem Relation Age of Onset   Breast cancer Sister    Immunization History  Administered Date(s) Administered   Influenza Whole 02/08/2014     Review of Systems: Negative except as noted in the HPI.   Objective: There were no vitals filed for this visit.  Marleni Roena Sassaman is a pleasant 81 y.o. female in NAD. AAO X 3.  Vascular Examination: CFT <4 seconds b/l LE. Faintly palpable DP pulses b/l LE. Faintly palpable PT pulse(s) b/l LE. Pedal hair absent. No pain with calf compression b/l. Lower extremity skin temperature gradient within normal limits. No edema noted b/l LE. Varicosities present b/l. No cyanosis or clubbing noted b/l LE.  Dermatological Examination: Pedal skin is warm and supple b/l LE. No interdigital macerations noted b/l LE. Toenails 1-5 bilaterally elongated, discolored, dystrophic, thickened, and crumbly with subungual debris and tenderness to dorsal palpation. No hyperkeratotic nor porokeratotic lesions present on today's visit.  No images are attached to the encounter.  Wound Location: 1st metatarsal head right lower extremity There is a no amount of devitalized tissue  present in the wound. There is evidence of small deroofed blister with no erythema, no edema, no drainage, no fluctuance, no tracking, nor tunneling. Predebridement Wound Measurement:  0.3  x 0.3 x 0.1 cm. No debridement of wound performed today. Wound Base: pink and granular Peri-wound: Normal Exudate: None: wound tissue dry Blood Loss during debridement: No debridement performed. Material in wound which inhibits healing/promotes adjacent tissue breakdown:   None . Description of tissue removed from ulceration today:   No debridement performed . Sign(s) of clinical bacterial infection: no clinical signs of infection noted on examination today.   Neurological Examination: Pt has subjective symptoms of neuropathy. Protective sensation diminished with 10g monofilament b/l. Vibratory sensation decreased b/l.  Musculoskeletal Examination: Muscle strength 5/5 to all lower extremity muscle groups bilaterally. No pain, crepitus or joint limitation noted with ROM bilateral LE. HAV with bunion deformity noted b/l LE. Severe hammertoe deformity noted 2-5 bilaterally.  Footwear Assessment: Does the patient wear appropriate shoes? Yes. Does the patient need inserts/orthotics? Yes.  Assessment: 1. Pain due to onychomycosis of toenails of both feet   2. Diabetic ulcer of right foot associated with type 2 diabetes mellitus, limited to breakdown of skin, unspecified part of foot ([place])   3. Hammer toe, unspecified laterality   4. Type 2 diabetes, controlled, with neuropathy ([place])   5. Encounter for diabetic foot exam ([place])     ADA Risk Categorization: High Risk  Patient has one or more of the following: Loss of protective sensation Absent pedal pulses Severe Foot deformity History of foot ulcer  Plan: -Patient was evaluated and treated. All patient's and/or POA's questions/concerns answered on today's visit. -Right foot ulcer cleansed with wound cleanser. Silvadene Cream and light dressing  applied. -Rx sent to pharmacy for Silvadene Cream. Printed instructions dispensed to patient for daily dressing changes. -Diabetic foot examination performed today. -Mycotic toenails 1-5 bilaterally were debrided in length and girth with sterile nail nippers and dremel without incident. -Patient scheduled to see Dr. [name] in two weeks for follow  up of diabetic ulcer right foot. -Patient/POA to call should there be question/concern in the interim. Return in about 2 weeks (around 10/29/2021).  Marzetta Board, DPM

## 2021-11-12 ENCOUNTER — Encounter: Payer: Self-pay | Admitting: Podiatry

## 2021-11-12 ENCOUNTER — Ambulatory Visit (INDEPENDENT_AMBULATORY_CARE_PROVIDER_SITE_OTHER): Payer: Medicare PPO | Admitting: Podiatry

## 2021-11-12 DIAGNOSIS — E1140 Type 2 diabetes mellitus with diabetic neuropathy, unspecified: Secondary | ICD-10-CM

## 2021-11-12 DIAGNOSIS — E11621 Type 2 diabetes mellitus with foot ulcer: Secondary | ICD-10-CM

## 2021-11-12 DIAGNOSIS — L97511 Non-pressure chronic ulcer of other part of right foot limited to breakdown of skin: Secondary | ICD-10-CM

## 2021-11-12 NOTE — Progress Notes (Signed)
  Subjective:  Patient ID: Shannon [name], female    DOB: 09-Jun-1940,   MRN: [id number]  Chief Complaint  Patient presents with   Diabetic Ulcer    Pt came in today for a follow-up of the right foot ulcer, which is doing better, Pt has a new ulcer which started 2 weeks ago. Pt denies any pain and drainage,    Pt's A1c- 5.9 BG- 109 (this morning)     81 y.o. female presents for follow-up of right foot ulcer. Was seen by Dr. Elisha [name] for rfc and noticed ulcer on right foot bunion area. Has been doing better and dressing as instructed. Thinks it is healed.   . Denies any other pedal complaints. Denies n/v/f/c.   Past Medical History:  Diagnosis Date   Allergy    Anxiety    Arthritis    Cancer ([name])    Cataract    Dementia ([name])    Dementia ([name])    Depression    Diabetes mellitus without complication ([name])    DM (diabetes mellitus) ([name])    GERD (gastroesophageal reflux disease)    Hyperlipidemia    Hypertension    Hypokalemia    Osteoarthritis    Stroke ([name])    Ulcer     Objective:  Physical Exam: Vascular: DP/PT pulses 2/4 bilateral. CFT <3 seconds. Absent hair growth on digits. Edema noted to bilateral lower extremities. Xerosis noted bilaterally.  Skin. No lacerations or abrasions bilateral feet. Nails 1-5 bilateral  are thickened discolored and elongated with subungual debris.  Right foot ulceration is healed. Left second toe blister with no underlying ulcer.  Musculoskeletal: MMT 5/5 bilateral lower extremities in DF, PF, Inversion and Eversion. Deceased ROM in DF of ankle joint.  Neurological: Sensation intact to light touch. Protective sensation diminished bilateral.    Assessment:   1. Diabetic ulcer of right foot associated with type 2 diabetes mellitus, limited to breakdown of skin, unspecified part of foot ([name])   2. Type 2 diabetes, controlled, with neuropathy ([name])      Plan:  Patient was evaluated and treated and all questions answered. Ulcer right foot-  healed, pre-ulcerative area noted to medial left second digit.  -No debridement necessary.  -Advised to continue with band aid to keep area protected.  -Off-loading with surgical shoe. -No abx indicated.  -Discussed glucose control and proper protein-[place] diet.  -Discussed if any worsening redness, pain, fever or chills to call or may need to report to the emergency room. Patient expressed understanding.  Patient to follow-up as scheduled with Dr. Elisha [name].     Return if symptoms worsen or fail to improve.   [name], DPM

## 2022-01-07 ENCOUNTER — Encounter: Payer: Self-pay | Admitting: Podiatry

## 2022-01-07 ENCOUNTER — Ambulatory Visit (INDEPENDENT_AMBULATORY_CARE_PROVIDER_SITE_OTHER): Payer: Medicare PPO | Admitting: Podiatry

## 2022-01-07 DIAGNOSIS — L84 Corns and callosities: Secondary | ICD-10-CM

## 2022-01-07 DIAGNOSIS — E1142 Type 2 diabetes mellitus with diabetic polyneuropathy: Secondary | ICD-10-CM

## 2022-01-07 DIAGNOSIS — M79675 Pain in left toe(s): Secondary | ICD-10-CM

## 2022-01-07 DIAGNOSIS — B351 Tinea unguium: Secondary | ICD-10-CM | POA: Diagnosis not present

## 2022-01-07 DIAGNOSIS — M79674 Pain in right toe(s): Secondary | ICD-10-CM

## 2022-01-12 NOTE — Progress Notes (Signed)
  Subjective:  Patient ID: Shannon [name], female    DOB: July 24, 1940,  MRN: [id number]  Shannon [name] presents to clinic today for at risk foot care with history diabetic ulcer right lower extremity and painful thick toenails that are difficult to trim. Painful toenails interfere with ambulation. Aggravating factors include wearing enclosed shoe gear. Pain is relieved with periodic professional debridement. Painful calluses are aggravated when weightbearing with and without shoegear. Pain is relieved with periodic professional debridement.  Patient has been followed by Dr. [name] for diabetic ulceration of right foot and it has resolved.  Patient states blood glucose was 109 mg/dl today. Last known HgA1c was unknown.  New problem(s): None.   PCP is Roetta Sessions, NP , and last visit was August, 2023.  Allergies  Allergen Reactions   Peanut Oil Anaphylaxis   Demerol  [Meperidine Hcl] Nausea And Vomiting   Other Hives    Takes skin off   Demerol [Meperidine]    Peanut Butter Flavor    Tape Dermatitis    Review of Systems: Negative except as noted in the HPI.  Objective: No changes noted in today's physical examination.  Shannon [name] is a pleasant 81 y.o. female in NAD. AAO x 3.  Vascular Examination: CFT <4 seconds b/l LE. Faintly palpable DP pulses b/l LE. Faintly palpable PT pulse(s) b/l LE. Pedal hair absent. No pain with calf compression b/l. Lower extremity skin temperature gradient within normal limits. No edema noted b/l LE. Varicosities present b/l. No cyanosis or clubbing noted b/l LE.  Dermatological Examination: Pedal skin is warm and supple b/l LE. No interdigital macerations noted b/l LE. Toenails 1-5 bilaterally elongated, discolored, dystrophic, thickened, and crumbly with subungual debris and tenderness to dorsal palpation.   Hyperkeratotic lesion(s) distal tip of right great toe.  No erythema, no edema, no drainage, no  fluctuance.  Neurological Examination: Pt has subjective symptoms of neuropathy. Protective sensation diminished with 10g monofilament b/l. Vibratory sensation decreased b/l.  Musculoskeletal Examination: Muscle strength 5/5 to all lower extremity muscle groups bilaterally. No pain, crepitus or joint limitation noted with ROM bilateral LE. HAV with bunion deformity noted b/l LE. Severe hammertoe deformity noted 2-5 bilaterally. Patient ambulates with cane assistance.  Assessment/Plan: 1. Pain due to onychomycosis of toenails of both feet   2. Callus   3. 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)     -Consent given for treatment as described below: -Examined patient. -Diabetic foot ulcer right foot has healed under care of Dr. [name]. -Patient to continue soft, supportive shoe gear daily. -Toenails 1-5 b/l were debrided in length and girth with sterile nail nippers and dremel without iatrogenic bleeding.  -Callus(es) distal tip of right great toe pared utilizing sterile scalpel blade without complication or incident. Total number debrided =1. -Patient/POA to call should there be question/concern in the interim.   Return in about 3 months (around 04/08/2022).  Marzetta Board, DPM

## 2022-02-02 ENCOUNTER — Other Ambulatory Visit: Payer: Self-pay | Admitting: Nurse Practitioner

## 2022-02-02 DIAGNOSIS — Z1231 Encounter for screening mammogram for malignant neoplasm of breast: Secondary | ICD-10-CM

## 2022-03-23 ENCOUNTER — Ambulatory Visit
Admission: RE | Admit: 2022-03-23 | Discharge: 2022-03-23 | Disposition: A | Discharge status: Home / Self Care | Payer: Medicare PPO | Source: Ambulatory Visit | Attending: Nurse Practitioner | Admitting: Nurse Practitioner

## 2022-03-23 DIAGNOSIS — Z1231 Encounter for screening mammogram for malignant neoplasm of breast: Secondary | ICD-10-CM

## 2022-04-27 ENCOUNTER — Ambulatory Visit (INDEPENDENT_AMBULATORY_CARE_PROVIDER_SITE_OTHER): Payer: Medicare PPO | Admitting: Podiatry

## 2022-04-27 ENCOUNTER — Encounter: Payer: Self-pay | Admitting: Podiatry

## 2022-04-27 VITALS — BP 131/66

## 2022-04-27 DIAGNOSIS — B351 Tinea unguium: Secondary | ICD-10-CM | POA: Diagnosis not present

## 2022-04-27 DIAGNOSIS — E1142 Type 2 diabetes mellitus with diabetic polyneuropathy: Secondary | ICD-10-CM | POA: Diagnosis not present

## 2022-04-27 DIAGNOSIS — M79674 Pain in right toe(s): Secondary | ICD-10-CM | POA: Diagnosis not present

## 2022-04-27 DIAGNOSIS — L84 Corns and callosities: Secondary | ICD-10-CM

## 2022-04-27 DIAGNOSIS — M79675 Pain in left toe(s): Secondary | ICD-10-CM | POA: Diagnosis not present

## 2022-04-27 NOTE — Progress Notes (Signed)
  Subjective:  Patient ID: Shannon [name], female    DOB: 1940/12/24,  MRN: [id number]  Shannon [name] presents to clinic today for at risk foot care with history of diabetic neuropathy and painful thick toenails that are difficult to trim. Pain interferes with ambulation. Aggravating factors include wearing enclosed shoe gear. Pain is relieved with periodic professional debridement.  Chief Complaint  Patient presents with   Nail Problem    DFC BS-101 A1C-6.7 PCP-Courtney Domingo Cocking PCP VST-1 month ago   New problem(s): None.   Patient states her right great toe has improved because she has been applying moisturizer to her feet daily.  PCP is Roetta Sessions, NP.  Allergies  Allergen Reactions   Peanut Oil Anaphylaxis   Demerol  [Meperidine Hcl] Nausea And Vomiting   Other Hives    Takes skin off   Demerol [Meperidine]    Peanut Butter Flavor    Tape Dermatitis    Review of Systems: Negative except as noted in the HPI.  Objective: No changes noted in today's physical examination. Vitals:   04/27/22 1325  BP: 131/66   Shannon [name] is a pleasant 82 y.o. female in NAD. AAO x 3.  Vascular Examination: CFT <4 seconds b/l LE. Faintly palpable DP pulses b/l LE. Faintly palpable PT pulse(s) b/l LE. Pedal hair absent. No pain with calf compression b/l. Lower extremity skin temperature gradient within normal limits. No edema noted b/l LE. Varicosities present b/l. No cyanosis or clubbing noted b/l LE.  Dermatological Examination: Pedal skin is warm and supple b/l LE. No interdigital macerations noted b/l LE. Toenails 1-5 bilaterally elongated, discolored, dystrophic, thickened, and crumbly with subungual debris and tenderness to dorsal palpation.   There is now minimal hyperkeratosis of the right great toe.  Neurological Examination: Pt has subjective symptoms of neuropathy. Protective sensation diminished with 10g monofilament b/l. Vibratory sensation decreased  b/l.  Musculoskeletal Examination: Muscle strength 5/5 to all lower extremity muscle groups bilaterally. No pain, crepitus or joint limitation noted with ROM bilateral LE. HAV with bunion deformity noted b/l LE. Severe hammertoe deformity noted 2-5 bilaterally. Patient ambulates with cane assistance.  Assessment/Plan: 1. Pain due to onychomycosis of toenails of both feet   2. Diabetic peripheral neuropathy associated with type 2 diabetes mellitus ([place])     -Patient was evaluated and treated. All patient's and/or POA's questions/concerns answered on today's visit. -Continue supportive shoe gear daily. -Mycotic toenails 1-5 bilaterally were debrided in length and girth with sterile nail nippers and dremel without incident. -Patient/POA to call should there be question/concern in the interim.   Return in about 3 months (around 07/27/2022).  Marzetta Board, DPM

## 2022-08-05 ENCOUNTER — Ambulatory Visit (INDEPENDENT_AMBULATORY_CARE_PROVIDER_SITE_OTHER): Payer: Medicare PPO | Admitting: Podiatry

## 2022-08-05 VITALS — BP 118/60

## 2022-08-05 DIAGNOSIS — E1142 Type 2 diabetes mellitus with diabetic polyneuropathy: Secondary | ICD-10-CM

## 2022-08-05 DIAGNOSIS — B351 Tinea unguium: Secondary | ICD-10-CM | POA: Diagnosis not present

## 2022-08-05 DIAGNOSIS — M79674 Pain in right toe(s): Secondary | ICD-10-CM | POA: Diagnosis not present

## 2022-08-05 DIAGNOSIS — M79675 Pain in left toe(s): Secondary | ICD-10-CM | POA: Diagnosis not present

## 2022-08-05 NOTE — Progress Notes (Signed)
  Subjective:  Patient ID: Shannon [name], female    DOB: [birth year],  MRN: [id number]  Shannon [name] presents to clinic today for at risk foot care with history of diabetic neuropathy and painful elongated mycotic toenails 1-5 bilaterally which are tender when wearing enclosed shoe gear. Pain is relieved with periodic professional debridement.  Chief Complaint  Patient presents with   Nail Problem    DFC BS-103 A1C-6.5 PCP-Courtney Neale Burly PCP VST- Early 2024   Patient states she has had some left ankle pain recently.  PCP is Joycelyn Man, NP.  Allergies  Allergen Reactions   Peanut Oil Anaphylaxis   Demerol  [Meperidine Hcl] Nausea And Vomiting   Other Hives    Takes skin off   Demerol [Meperidine]    Peanut Butter Flavor    Tape Dermatitis    Review of Systems: Negative except as noted in the HPI.  Objective: No changes noted in today's physical examination. Vitals:   08/05/22 1336  BP: 118/60   Shannon [name] is a pleasant 82 y.o. female obese in NAD. AAO x 3.  Vascular Examination: CFT <4 seconds b/l LE. Faintly palpable DP pulses b/l LE. Faintly palpable PT pulse(s) b/l LE. Pedal hair absent. No pain with calf compression b/l. Lower extremity skin temperature gradient within normal limits. No edema noted b/l LE. Varicosities present b/l. No cyanosis or clubbing noted b/l LE.  Dermatological Examination: Pedal skin is warm and supple b/l LE. No interdigital macerations noted b/l LE. Toenails 1-5 bilaterally elongated, discolored, dystrophic, thickened, and crumbly with subungual debris and tenderness to dorsal palpation.   There is now minimal hyperkeratosis of the right great toe.  Neurological Examination: Pt has subjective symptoms of neuropathy. Protective sensation diminished with 10g monofilament b/l. Vibratory sensation decreased b/l.  Musculoskeletal Examination: Muscle strength 5/5 to all lower extremity muscle groups bilaterally. No  pain, crepitus or joint limitation noted with ROM bilateral LE. HAV with bunion deformity noted b/l LE. Severe hammertoe deformity noted 2-5 bilaterally. Patient ambulates with cane assistance.  Assessment/Plan: 1. Pain due to onychomycosis of toenails of both feet   2. Diabetic peripheral neuropathy associated with type 2 diabetes mellitus   -Consent given for treatment as described below: -Examined patient. -Continue foot and shoe inspections daily. Monitor blood glucose per PCP/Endocrinologist's recommendations. -Patient to continue soft, supportive shoe gear daily. -Toenails 1-5 b/l were debrided in length and girth with sterile nail nippers and dremel without iatrogenic bleeding.  -Patient/POA to call should there be question/concern in the interim.   Return in about 3 months (around 11/04/2022).  Freddie Breech, DPM

## 2022-08-09 ENCOUNTER — Encounter: Payer: Self-pay | Admitting: Podiatry

## 2022-12-30 ENCOUNTER — Encounter: Payer: Self-pay | Admitting: Podiatry

## 2022-12-30 ENCOUNTER — Ambulatory Visit (INDEPENDENT_AMBULATORY_CARE_PROVIDER_SITE_OTHER): Payer: Medicare PPO | Admitting: Podiatry

## 2022-12-30 DIAGNOSIS — M2011 Hallux valgus (acquired), right foot: Secondary | ICD-10-CM | POA: Diagnosis not present

## 2022-12-30 DIAGNOSIS — Z8631 Personal history of diabetic foot ulcer: Secondary | ICD-10-CM

## 2022-12-30 DIAGNOSIS — M79674 Pain in right toe(s): Secondary | ICD-10-CM

## 2022-12-30 DIAGNOSIS — B351 Tinea unguium: Secondary | ICD-10-CM | POA: Diagnosis not present

## 2022-12-30 DIAGNOSIS — E1142 Type 2 diabetes mellitus with diabetic polyneuropathy: Secondary | ICD-10-CM | POA: Diagnosis not present

## 2022-12-30 DIAGNOSIS — E119 Type 2 diabetes mellitus without complications: Secondary | ICD-10-CM

## 2022-12-30 DIAGNOSIS — M79675 Pain in left toe(s): Secondary | ICD-10-CM

## 2022-12-30 DIAGNOSIS — M2040 Other hammer toe(s) (acquired), unspecified foot: Secondary | ICD-10-CM | POA: Diagnosis not present

## 2022-12-30 DIAGNOSIS — M2012 Hallux valgus (acquired), left foot: Secondary | ICD-10-CM

## 2023-01-03 NOTE — Progress Notes (Signed)
ANNUAL DIABETIC FOOT EXAM  Subjective: Shannon [name] presents today annual diabetic foot exam. She is a resident of [facility]. She is requesting diabetic shoes on today's visit. She has h/o diabetic ulcer right foot. Chief Complaint  Patient presents with   Diabetes    DFC BS - 118 A1C - 6.5 LVPCP - DK   Patient confirms h/o diabetes.  Patient denies any h/o foot wounds.  Risk factors: diabetes, neuropathy, PAD, h/o CVA, HTN, hyperlipidemia.  Shannon Man, NP is patient's PCP.  Past Medical History:  Diagnosis Date   Allergy    Anxiety    Arthritis    Cancer (HCC)    Cataract    Dementia (HCC)    Dementia (HCC)    Depression    Diabetes mellitus without complication (HCC)    DM (diabetes mellitus) (HCC)    GERD (gastroesophageal reflux disease)    Hyperlipidemia    Hypertension    Hypokalemia    Osteoarthritis    Stroke [facility])    Ulcer    Patient Active Problem List   Diagnosis Date Noted   Acute frontal sinusitis 07/04/2020   Chronic obstructive pulmonary disease (HCC) 07/04/2020   Polyneuropathy due to type 2 diabetes mellitus (HCC) 07/04/2020   Essential hypertension 07/04/2020   Functional urinary incontinence 07/04/2020   Hyperglycemia due to type 2 diabetes mellitus (HCC) 07/04/2020   Mixed hyperlipidemia 07/04/2020   Hypothyroidism 07/04/2020   Irritable bowel syndrome 07/04/2020   Other long term (current) drug therapy 07/04/2020   Other vitamin B12 deficiency anemias 07/04/2020   Peripheral vascular disease (HCC) 07/04/2020   Type 2 diabetes mellitus without complications (HCC) 07/04/2020   Primary generalized (osteo)arthritis 07/04/2020   Vitamin D deficiency 07/04/2020   Depression 10/15/2016   Past Surgical History:  Procedure Laterality Date   JOINT REPLACEMENT Bilateral    Current Outpatient Medications on File Prior to Visit  Medication Sig Dispense Refill   acetaminophen-codeine (TYLENOL #3) 300-30 MG  tablet Take 1 tablet by mouth 2 (two) times daily as needed. (Patient not taking: Reported on 03/03/2021)     aspirin 81 MG EC tablet      azithromycin (ZITHROMAX) 250 MG tablet Take 250 mg by mouth as directed. (Patient not taking: Reported on 03/03/2021)     Black Cohosh 40 MG CAPS 1 tablet in the morning     butalbital-acetaminophen-caffeine (FIORICET WITH CODEINE) 50-325-40-30 MG capsule Take 1 capsule by mouth every 4 (four) hours as needed for headache.     carbamide peroxide (DEBROX) 6.5 % OTIC solution 5 drops 2 (two) times daily.     celecoxib (CELEBREX) 200 MG capsule      cetirizine (ZYRTEC) 10 MG tablet      citalopram (CELEXA) 20 MG tablet Take 20 mg by mouth daily.     clotrimazole (LOTRIMIN) 1 % cream Apply 1 application topically 2 (two) times daily.     donepezil (ARICEPT) 5 MG tablet Take 5 mg by mouth daily.     EPINEPHrine (ADRENALIN) 0.1 % nasal solution Place 1 drop into the nose once.     EPINEPHrine 0.3 mg/0.3 mL IJ SOAJ injection SMARTSIG:0.3 Milliliter(s) IM Once PRN     erythromycin ophthalmic ointment SMARTSIG:In Eye(s) (Patient not taking: Reported on 03/03/2021)     fluticasone (FLONASE) 50 MCG/ACT nasal spray      furosemide (LASIX) 20 MG tablet Take 20 mg by mouth.     gabapentin (NEURONTIN) 600 MG tablet  HYDROcodone-acetaminophen (NORCO/VICODIN) 5-325 MG tablet Take 1 tablet by mouth every 6 (six) hours as needed for moderate pain. (Patient not taking: Reported on 03/03/2021)     INVOKANA 300 MG TABS      LANTUS SOLOSTAR 100 UNIT/ML Solostar Pen      lidocaine (LIDODERM) 5 %  (Patient not taking: Reported on 03/03/2021)     lidocaine 4 % SMARTSIG:Topical     LORazepam (ATIVAN) 1 MG tablet Take 1 mg by mouth every 8 (eight) hours.     losartan (COZAAR) 25 MG tablet Take 25 mg by mouth daily.     lubiprostone (AMITIZA) 24 MCG capsule 1 capsule with food     metFORMIN (GLUCOPHAGE) 500 MG tablet      MIRALAX 17 GM/SCOOP powder SMARTSIG:17 Gram(s) By Mouth  Every Morning     MUCINEX 600 MG 12 hr tablet Take 600 mg by mouth 2 (two) times daily.     MYRBETRIQ 25 MG TB24 tablet 50 mg.      nitrofurantoin, macrocrystal-monohydrate, (MACROBID) 100 MG capsule Take 100 mg by mouth 2 (two) times daily. (Patient not taking: Reported on 03/03/2021)     NOVOFINE 32G X 6 MM MISC      nystatin (MYCOSTATIN/NYSTOP) 100000 UNIT/GM POWD  (Patient not taking: Reported on 03/03/2021)     omeprazole (PRILOSEC) 20 MG capsule      ONGLYZA 5 MG TABS tablet      pioglitazone-metformin (ACTOPLUS MET) 15-500 MG tablet Take 1 tablet by mouth 2 (two) times daily.     Polyethyl Glycol-Propyl Glycol (SYSTANE) 0.4-0.3 % GEL ophthalmic gel Place 1 application into both eyes. (Patient not taking: Reported on 03/03/2021)     potassium chloride 20 MEQ/100ML IVPB 1 packet with food     potassium chloride SA (KLOR-CON M) 20 MEQ tablet Take 20 mEq by mouth daily.     Prodigy Lancets 28G MISC      silver sulfADIAZINE (SILVADENE) 1 % cream APPLY TO ULCER RIGHT FOOT ONCE DAILY 50 g 0   Skin Protectants, Misc. (MINERIN) CREA Apply topically.     sulfamethoxazole-trimethoprim (BACTRIM) 400-80 MG tablet Take 1 tablet by mouth 2 (two) times daily. (Patient not taking: Reported on 03/03/2021) 28 tablet 0   traMADol (ULTRAM-ER) 200 MG 24 hr tablet  (Patient not taking: Reported on 03/03/2021)     TraMADol HCl 200 MG TB24 Take 1 tablet by mouth daily.     traZODone (DESYREL) 100 MG tablet Take 100 mg by mouth at bedtime.     triamcinolone cream (KENALOG) 0.1 % Apply 1 application topically 2 (two) times daily.     Vitamin D, Ergocalciferol, 50000 units CAPS Take 1 capsule by mouth once a week.     Wound Dressings (MEDIHONEY WOUND/BURN DRESSING) GEL Apply to left toe ulceration 3x per week 15 mL 1   No current facility-administered medications on file prior to visit.    Allergies  Allergen Reactions   Peanut Oil Anaphylaxis   Demerol  [Meperidine Hcl] Nausea And Vomiting   Other Hives     Takes skin off   Demerol [Meperidine]    Peanut Butter Flavor    Tape Dermatitis   Social History   Occupational History   Not on file  Tobacco Use   Smoking status: Never   Smokeless tobacco: Never  Substance and Sexual Activity   Alcohol use: No   Drug use: No   Sexual activity: Not on file   Family History  Problem Relation Age of  Onset   Breast cancer Sister    Immunization History  Administered Date(s) Administered   Influenza Whole 02/08/2014     Review of Systems: Negative except as noted in the HPI.   Objective: There were no vitals filed for this visit.  Shannon [name] is a pleasant 82 y.o. female in NAD. AAO X 3.  Vascular Examination: CFT <3 seconds b/l. DP/PT pulses faintly palpable b/l. Skin temperature gradient warm to warm b/l. No pain with calf compression. No ischemia or gangrene. No cyanosis or clubbing noted b/l.    Neurological Examination: Sensation grossly intact b/l with 10 gram monofilament. Pt has subjective symptoms of neuropathy. Vibratory sensation intact right lower extremity. Vibratory sensation diminished left lower extremity.  Dermatological Examination: Pedal skin warm and supple b/l.   No open wounds. No interdigital macerations.  Toenails 1-5 b/l thick, discolored, elongated with subungual debris and pain on dorsal palpation.    No hyperkeratotic nor porokeratotic lesions present on today's visit.  Musculoskeletal Examination: Normal muscle strength 5/5 to all lower extremity muscle groups bilaterally. No pain, crepitus or joint limitation noted with ROM b/l LE. HAV with bunion bilaterally and hammertoes 2-5 b/l.Marland Kitchen Patient ambulates with cane assistance.  Radiographs: None  Assessment: 1. Pain due to onychomycosis of toenails of both feet   2. History of diabetic ulcer of foot   3. Hammer toe, unspecified laterality   4. Hallux valgus, acquired, bilateral   5. Diabetic peripheral neuropathy associated with type 2 diabetes  mellitus (HCC)   6. Encounter for diabetic foot exam [facility])     Plan: -Patient was evaluated and treated. All patient's and/or POA's questions/concerns answered on today's visit. -Diabetic foot examination performed today. -Continue diabetic foot care principles: inspect feet daily, monitor glucose as recommended by PCP and/or Endocrinologist, and follow prescribed diet per PCP, Endocrinologist and/or dietician. -Continue supportive shoe gear daily. -Mycotic toenails 1-5 bilaterally were debrided in length and girth with sterile nail nippers and dremel without incident. -Patient/POA to call should there be question/concern in the interim. Return in about 3 months (around 03/31/2023).  Freddie Breech, DPM

## 2023-01-08 IMAGING — MG MM DIGITAL SCREENING BILAT W/ TOMO AND CAD
8 series · 8 of 24 positions shown · non-contrast
Comparison: Previous exam(s).

CLINICAL DATA: Screening.

EXAM:
DIGITAL SCREENING BILATERAL MAMMOGRAM WITH TOMOSYNTHESIS AND CAD
TECHNIQUE: Bilateral screening digital craniocaudal and mediolateral oblique
mammograms were obtained. Bilateral screening digital breast
tomosynthesis was performed. The images were evaluated with
computer-aided detection.

[R MLO synth-2D]
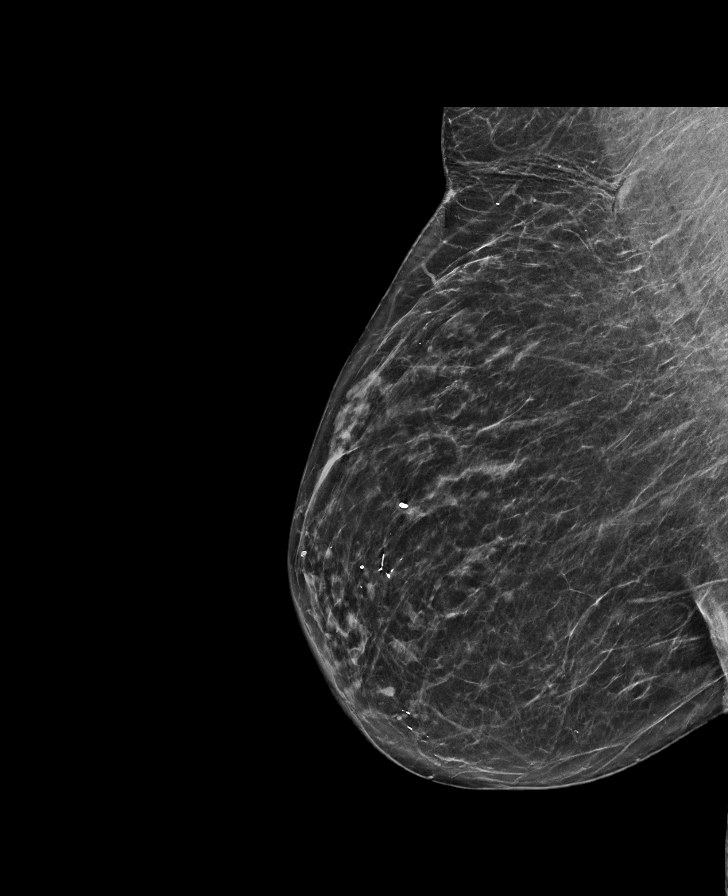

[L MLO synth-2D]
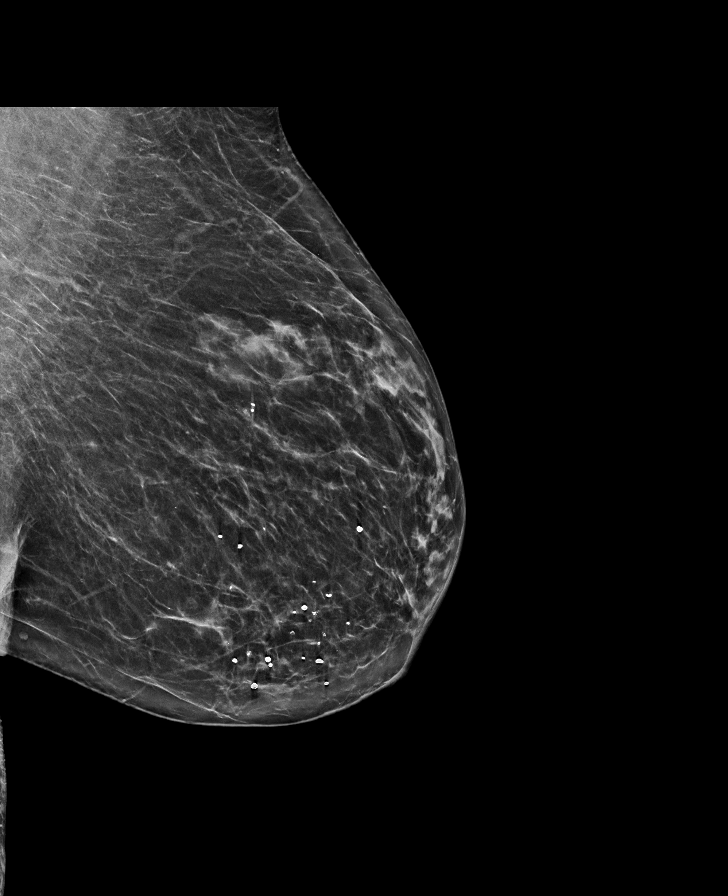

[L CC synth-2D]
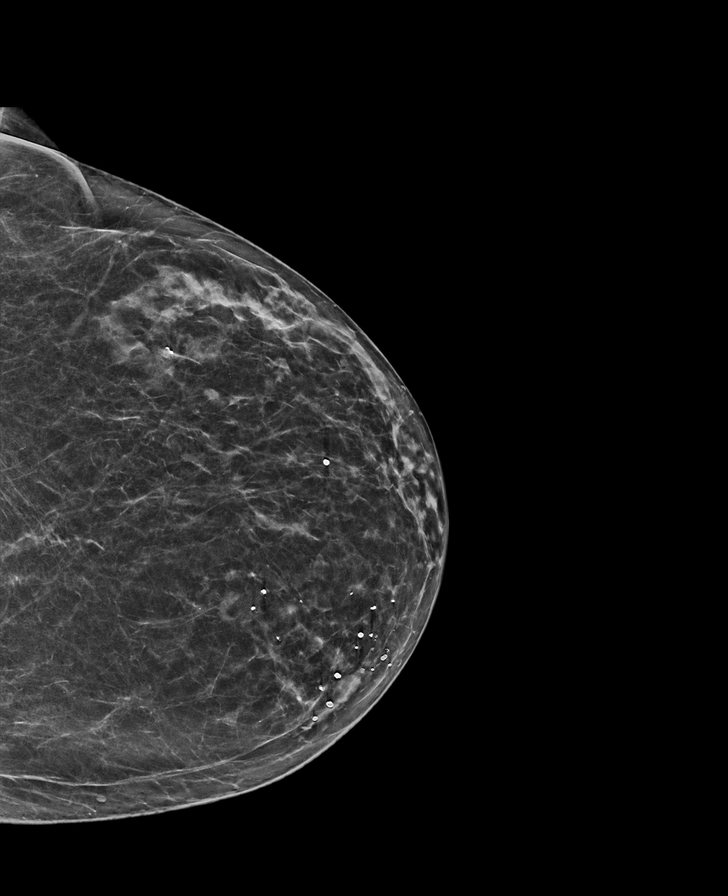

[R CC synth-2D]
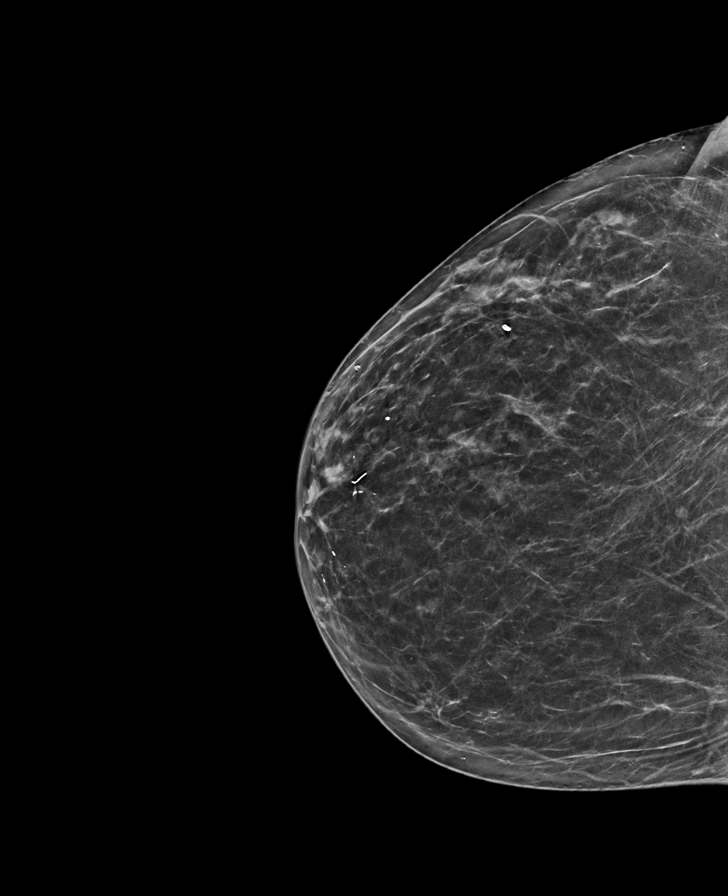

[R MLO tomo · tomo slice 33/64.0]
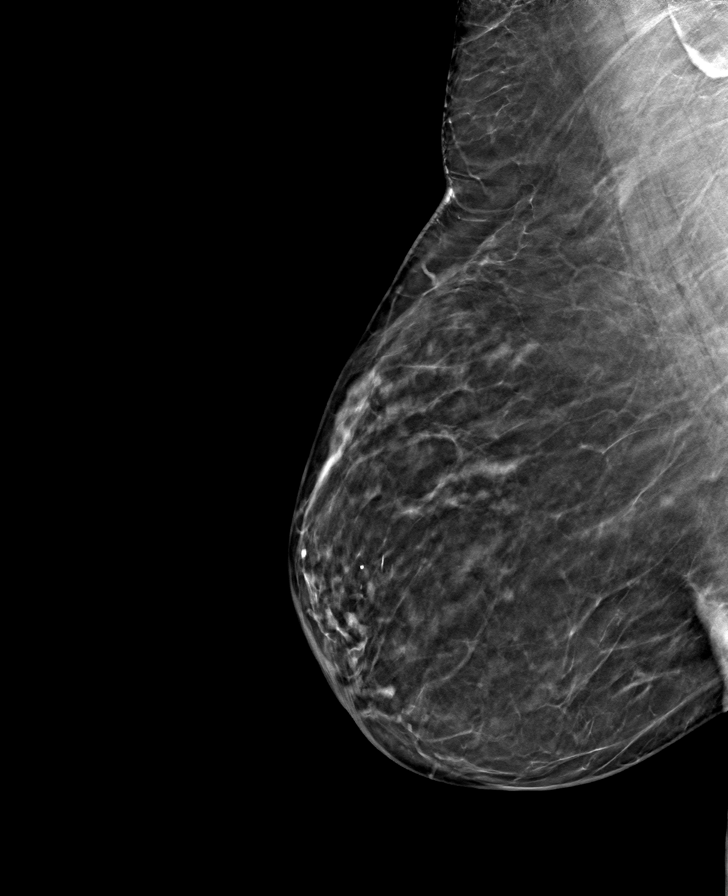

[L CC tomo · tomo slice 31/62.0]
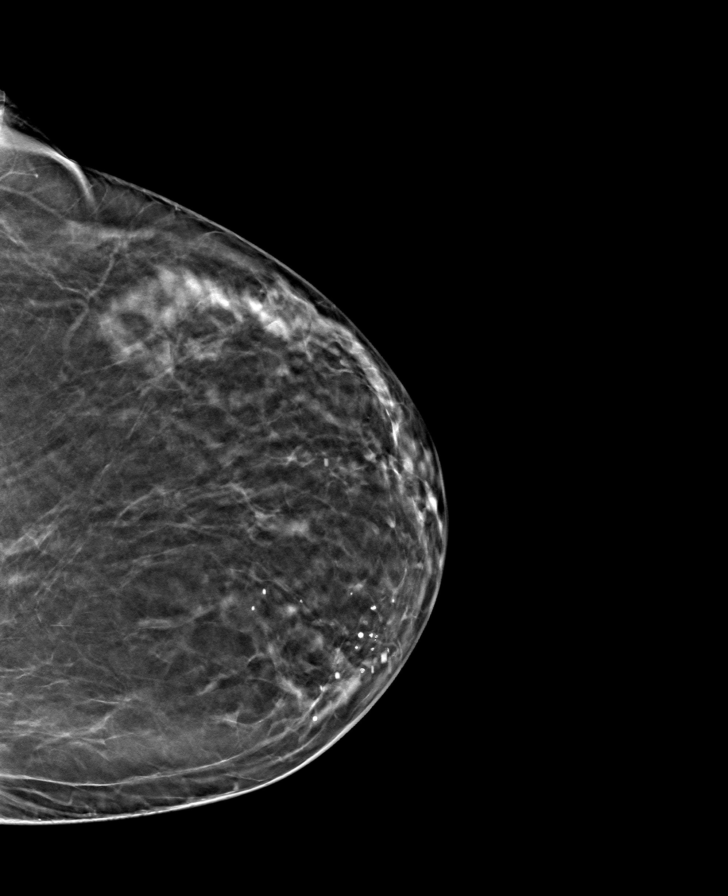

[R CC tomo · tomo slice 31/61.0]
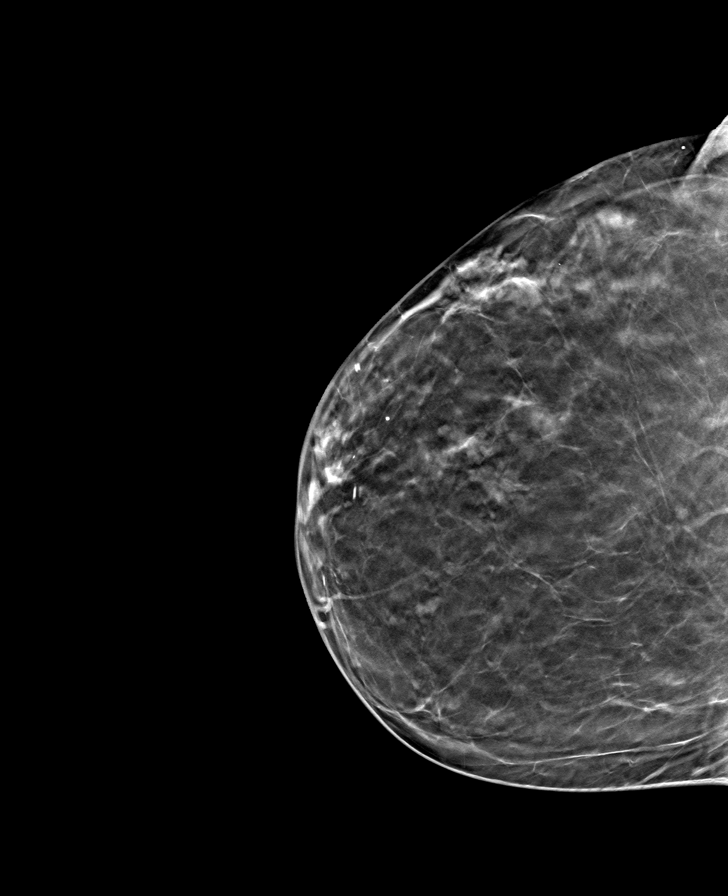

[L MLO tomo · tomo slice 35/68.0]
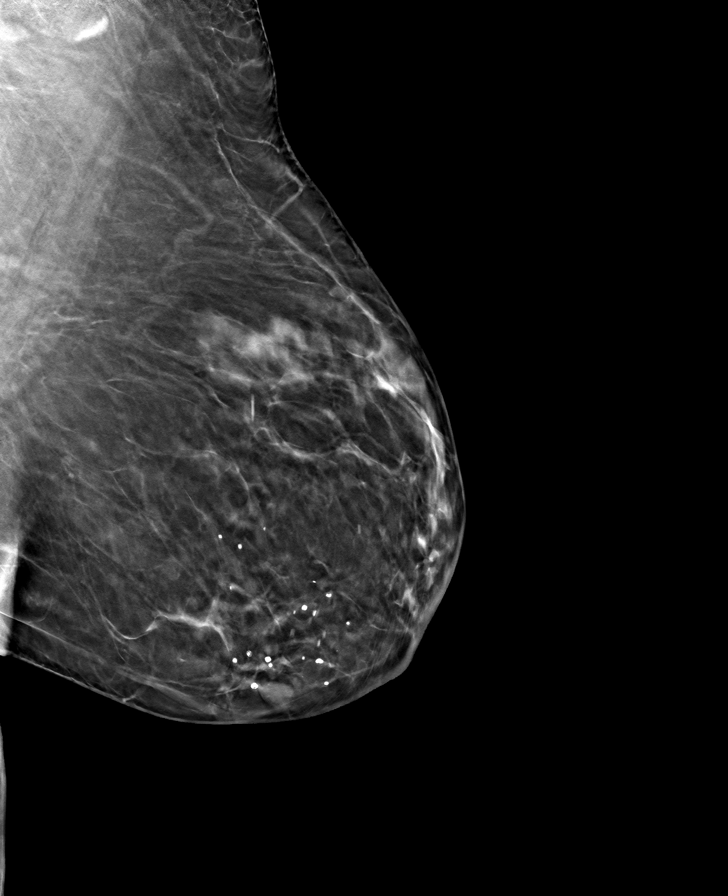

[8 of 24 positions shown; findings below may reference images not displayed]

ACR Breast Density Category b: There are scattered areas of
fibroglandular density.
FINDINGS: There are no findings suspicious for malignancy.
IMPRESSION: No mammographic evidence of malignancy. A result letter of this
screening mammogram will be mailed directly to the patient.

RECOMMENDATION:
Screening mammogram in one year. (Code:51-O-LD2)

BI-RADS CATEGORY  1: Negative.

## 2023-03-31 ENCOUNTER — Ambulatory Visit (INDEPENDENT_AMBULATORY_CARE_PROVIDER_SITE_OTHER): Payer: Medicare PPO | Admitting: Podiatry

## 2023-03-31 ENCOUNTER — Encounter: Payer: Self-pay | Admitting: Podiatry

## 2023-03-31 DIAGNOSIS — M79674 Pain in right toe(s): Secondary | ICD-10-CM

## 2023-03-31 DIAGNOSIS — E1142 Type 2 diabetes mellitus with diabetic polyneuropathy: Secondary | ICD-10-CM

## 2023-03-31 DIAGNOSIS — B351 Tinea unguium: Secondary | ICD-10-CM

## 2023-03-31 DIAGNOSIS — M79675 Pain in left toe(s): Secondary | ICD-10-CM

## 2023-03-31 NOTE — Progress Notes (Signed)
  Subjective:  Patient ID: Shannon [name], female    DOB: 1941-03-19,   MRN: [id number]  No chief complaint on file.   82 y.o. female presents for concern of thickened elongated and painful nails that are difficult to trim. Requesting to have them trimmed today. Relates burning and tingling in their feet. Patient is diabetic and last A1c was No results found for: "HGBA1C" .   PCP:  Joycelyn Man, NP    . Denies any other pedal complaints. Denies n/v/f/c.   Past Medical History:  Diagnosis Date   Allergy    Anxiety    Arthritis    Cancer (HCC)    Cataract    Dementia (HCC)    Dementia (HCC)    Depression    Diabetes mellitus without complication (HCC)    DM (diabetes mellitus) (HCC)    GERD (gastroesophageal reflux disease)    Hyperlipidemia    Hypertension    Hypokalemia    Osteoarthritis    Stroke (HCC)    Ulcer     Objective:  Physical Exam: Vascular: DP/PT pulses 2/4 bilateral. CFT <3 seconds. Absent hair growth on digits. Edema noted to bilateral lower extremities. Xerosis noted bilaterally.  Skin. No lacerations or abrasions bilateral feet. Nails 1-5 bilateral  are thickened discolored and elongated with subungual debris.  Musculoskeletal: MMT 5/5 bilateral lower extremities in DF, PF, Inversion and Eversion. Deceased ROM in DF of ankle joint.  Neurological: Sensation intact to light touch. Protective sensation diminished bilateral.    Assessment:   1. Pain due to onychomycosis of toenails of both feet   2. Diabetic peripheral neuropathy associated with type 2 diabetes mellitus (HCC)      Plan:  Patient was evaluated and treated and all questions answered. -Discussed and educated patient on diabetic foot care, especially with  regards to the vascular, neurological and musculoskeletal systems.  -Stressed the importance of good glycemic control and the detriment of not  controlling glucose levels in relation to the foot. -Discussed supportive shoes  at all times and checking feet regularly.  -Mechanically debrided all nails 1-5 bilateral using sterile nail nipper and filed with dremel without incident  -Answered all patient questions -Patient to return  in 3 months for at risk foot care -Patient advised to call the office if any problems or questions arise in the meantime.   Louann Sjogren, DPM

## 2023-05-25 ENCOUNTER — Other Ambulatory Visit: Payer: Self-pay | Admitting: Nurse Practitioner

## 2023-05-25 DIAGNOSIS — Z1231 Encounter for screening mammogram for malignant neoplasm of breast: Secondary | ICD-10-CM

## 2023-06-09 ENCOUNTER — Ambulatory Visit: Payer: Medicare PPO

## 2023-06-23 ENCOUNTER — Ambulatory Visit: Payer: Medicare PPO

## 2023-06-30 ENCOUNTER — Ambulatory Visit (INDEPENDENT_AMBULATORY_CARE_PROVIDER_SITE_OTHER): Payer: Medicare PPO | Admitting: Podiatry

## 2023-06-30 ENCOUNTER — Encounter: Payer: Self-pay | Admitting: Podiatry

## 2023-06-30 VITALS — Ht 64.0 in | Wt 220.0 lb

## 2023-06-30 DIAGNOSIS — E1142 Type 2 diabetes mellitus with diabetic polyneuropathy: Secondary | ICD-10-CM

## 2023-06-30 DIAGNOSIS — M79674 Pain in right toe(s): Secondary | ICD-10-CM | POA: Diagnosis not present

## 2023-06-30 DIAGNOSIS — B351 Tinea unguium: Secondary | ICD-10-CM | POA: Diagnosis not present

## 2023-06-30 DIAGNOSIS — M79675 Pain in left toe(s): Secondary | ICD-10-CM | POA: Diagnosis not present

## 2023-06-30 NOTE — Progress Notes (Signed)
  Subjective:  Patient ID: Shannon [name], female    DOB: April 11, 1941,  MRN: [id number]  83 y.o. female presents with painful, elongated thickened toenails x 10 which are symptomatic when wearing enclosed shoe gear. This interferes with his/her daily activities. Chief Complaint  Patient presents with   Nail Problem    Pt is here for [facility] last A1C was 5.6 PCP is Dr Neale Burly and LOV was last year.     PCP: Joycelyn Man, NP.  New problem(s): None.   Review of Systems: Negative except as noted in the HPI.   Allergies  Allergen Reactions   Peanut Oil Anaphylaxis   Demerol  [Meperidine Hcl] Nausea And Vomiting   Other Hives    Takes skin off   Demerol [Meperidine]    Peanut Butter Flavoring Agent (Non-Screening)    Tape Dermatitis    Objective:  There were no vitals filed for this visit. Constitutional Patient is a pleasant 83 y.o. female obese in NAD. AAO x 3.  Vascular Capillary fill time to digits <3 seconds.  DP/PT pulse(s) are faintly palpable b/l lower extremities. Pedal hair absent b/l. Lower extremity skin temperature gradient warm to cool b/l. No pain with calf compression b/l. No cyanosis or clubbing noted. No ischemia nor gangrene noted b/l.   Neurologic Pt has subjective symptoms of neuropathy. Protective sensation intact 5/5 intact bilaterally with 10g monofilament b/l. Vibratory sensation intact right lower extremity. Vibratory sensation diminished left lower extremity.  Dermatologic Pedal skin is thin, shiny and atrophic b/l.  No open wounds b/l lower extremities. No interdigital macerations b/l lower extremities. Toenails 1-5 b/l elongated, discolored, dystrophic, thickened, crumbly with subungual debris and tenderness to dorsal palpation. No corns, calluses nor porokeratotic lesions noted.  Orthopedic: Normal muscle strength 5/5 to all lower extremity muscle groups bilaterally. HAV with bunion deformity noted b/l LE. Hammertoe deformity noted 2-5 b/l. Utilizes  rollator for ambulation assistance.   Last HgA1c:      No data to display           Assessment:   1. Pain due to onychomycosis of toenails of both feet   2. Diabetic peripheral neuropathy associated with type 2 diabetes mellitus (HCC)    Plan:  Patient was evaluated and treated. All patient's and/or POA's questions/concerns addressed on today's visit. Toenails 1-5 debrided in length and girth without incident. Continue foot and shoe inspections daily. Monitor blood glucose per PCP/Endocrinologist's recommendations. Continue soft, supportive shoe gear daily. Report any pedal injuries to medical professional. Call office if there are any questions/concerns. -Patient/POA to call should there be question/concern in the interim.  Return in about 3 months (around 09/30/2023).  Freddie Breech, DPM      [place] LOCATION: [address], [place] [address]                   Office [phone]   [facility] LOCATION: [address] [place], [place] [address] Office [phone]

## 2023-07-05 ENCOUNTER — Encounter: Payer: Self-pay | Admitting: Podiatry

## 2023-07-14 ENCOUNTER — Ambulatory Visit

## 2023-07-20 ENCOUNTER — Ambulatory Visit

## 2023-07-21 ENCOUNTER — Ambulatory Visit
Admission: RE | Admit: 2023-07-21 | Discharge: 2023-07-21 | Disposition: A | Discharge status: Home / Self Care | Source: Ambulatory Visit | Attending: Nurse Practitioner | Admitting: Nurse Practitioner

## 2023-07-21 DIAGNOSIS — Z1231 Encounter for screening mammogram for malignant neoplasm of breast: Secondary | ICD-10-CM

## 2023-10-20 ENCOUNTER — Ambulatory Visit (INDEPENDENT_AMBULATORY_CARE_PROVIDER_SITE_OTHER): Admitting: Podiatry

## 2023-10-20 ENCOUNTER — Encounter: Payer: Self-pay | Admitting: Podiatry

## 2023-10-20 DIAGNOSIS — E1142 Type 2 diabetes mellitus with diabetic polyneuropathy: Secondary | ICD-10-CM | POA: Diagnosis not present

## 2023-10-20 DIAGNOSIS — B351 Tinea unguium: Secondary | ICD-10-CM

## 2023-10-20 DIAGNOSIS — M79675 Pain in left toe(s): Secondary | ICD-10-CM

## 2023-10-20 DIAGNOSIS — M79674 Pain in right toe(s): Secondary | ICD-10-CM | POA: Diagnosis not present

## 2023-10-20 NOTE — Progress Notes (Signed)
  Subjective:  Patient ID: Shannon [name], female    DOB: 1940-09-11,  MRN: [id number]  Shannon [name] presents to clinic today for at risk foot care with history of diabetic neuropathy and painful mycotic toenails of both feet that are difficult to trim. Pain interferes with daily activities and wearing enclosed shoe gear comfortably. She states she needs to get a new pair of [employer] from [employer]. She is a resident of [facility]. Chief Complaint  Patient presents with   [facility]    [facility]. NIDDM A1C 6.5. LOV with PCP(unknown)   New problem(s): None.   PCP is Oneita Charmaine Agent, NP.  Allergies  Allergen Reactions   Peanut Oil Anaphylaxis   Demerol  [Meperidine Hcl] Nausea And Vomiting   Other Hives    Takes skin off   Demerol [Meperidine]    Peanut Butter Flavoring Agent (Non-Screening)    Tape Dermatitis    Review of Systems: Negative except as noted in the HPI.  Objective: No changes noted in today's physical examination. There were no vitals filed for this visit. Shannon [name] is a pleasant 83 y.o. female obese in NAD. AAO x 3.  Vascular Examination: CFT <3 seconds b/l. DP/PT pulses faintly palpable b/l. Skin temperature gradient warm to warm b/l. No pain with calf compression. No ischemia or gangrene. No cyanosis or clubbing noted b/l. Dependent edema noted b/l LE.   Neurological Examination: Sensation grossly intact b/l with 10 gram monofilament. Pt has subjective symptoms of neuropathy. Vibratory sensation intact right lower extremity. Vibratory sensation diminished left lower extremity.  Dermatological Examination: Pedal skin warm and supple b/l.   No open wounds. No interdigital macerations.  Toenails 1-5 b/l thick, discolored, elongated with subungual debris and pain on dorsal palpation.    Pedal skin thin and atrophic b/l LE. Toenails 1-5 bilaterally elongated, discolored, dystrophic, thickened, and crumbly with subungual debris and  tenderness to dorsal palpation. No corns, calluses nor porokeratotic lesions noted.  Musculoskeletal Examination: Muscle strength 5/5 to all lower extremity muscle groups bilaterally. No pain, crepitus or joint limitation noted with ROM b/l LE. HAV with bunion bilaterally and hammertoes 2-5 b/l.[name] Patient ambulates with cane assistance. She does have wear plantarlateral heel of right shoe.  Radiographs: None  Assessment/Plan: 1. Pain due to onychomycosis of toenails of both feet   2. Diabetic peripheral neuropathy associated with type 2 diabetes mellitus [facility])   Consent given for treatment. Patient examined. All patient's and/or POA's questions/concerns addressed on today's visit. Mycotic toenails 1-5 debrided in length and girth without incident. Continue foot and shoe inspections daily. Monitor blood glucose per PCP/Endocrinologist's recommendations.Continue soft, supportive shoe gear daily. Report any pedal injuries to medical professional. Call office if there are any quesitons/concerns. -Patient/POA to call should there be question/concern in the interim.   Return in about 3 months (around 01/20/2024).  Delon LITTIE Merlin, DPM      [place] LOCATION: [address], [place] [address]                   Office [phone]   [facility] LOCATION: [address] [place], [place] [address] Office [phone]

## 2024-02-02 ENCOUNTER — Ambulatory Visit: Admitting: Podiatry

## 2024-02-02 DIAGNOSIS — M2011 Hallux valgus (acquired), right foot: Secondary | ICD-10-CM

## 2024-02-02 DIAGNOSIS — M2041 Other hammer toe(s) (acquired), right foot: Secondary | ICD-10-CM

## 2024-02-02 DIAGNOSIS — M79675 Pain in left toe(s): Secondary | ICD-10-CM

## 2024-02-02 DIAGNOSIS — B351 Tinea unguium: Secondary | ICD-10-CM

## 2024-02-02 DIAGNOSIS — E1142 Type 2 diabetes mellitus with diabetic polyneuropathy: Secondary | ICD-10-CM

## 2024-02-02 DIAGNOSIS — M79674 Pain in right toe(s): Secondary | ICD-10-CM

## 2024-02-02 DIAGNOSIS — M2141 Flat foot [pes planus] (acquired), right foot: Secondary | ICD-10-CM | POA: Diagnosis not present

## 2024-02-02 DIAGNOSIS — Z0189 Encounter for other specified special examinations: Secondary | ICD-10-CM

## 2024-02-06 ENCOUNTER — Encounter: Payer: Self-pay | Admitting: Podiatry

## 2024-02-06 NOTE — Progress Notes (Signed)
  Subjective:  Patient ID: Shannon [name], female    DOB: Apr 15, 1941,  MRN: [id number]  Shannon [name], at risk foot care with history of diabetic neuropathy, and painful elongated mycotic toenails 1-5 bilaterally which are tender when wearing enclosed shoe gear. Pain is relieved with periodic professional debridement.  Chief Complaint  Patient presents with   Diabetes     NIDDM A1C 5.8. LOV with NP Archie Mania)  [facility]. 12/2023. Toenail trim.     New problem(s): None.   PCP is No primary care provider on file..  Allergies  Allergen Reactions   Peanut Oil Anaphylaxis   Demerol  [Meperidine Hcl] Nausea And Vomiting   Other Hives    Takes skin off   Demerol [Meperidine]    Peanut Butter Flavoring Agent (Non-Screening)    Tape Dermatitis    Review of Systems: Negative except as noted in the HPI.  Objective: No changes noted in today's physical [name]. There were no vitals filed for this visit. Shannon [name] is a pleasant 83 y.o. female in NAD. AAO x 3.   Diabetic foot exam was performed with the following findings:   Vascular [name]: CFT <3 seconds b/l. DP/PT pulses faintly palpable b/l. Pedal edema trace b/l. Skin temperature gradient warm to warm b/l. Digital hair absent. No pain with calf compression. No ischemia or gangrene. No cyanosis or clubbing noted b/l.    Neurological [name]: Pt has subjective symptoms of neuropathy. Protective sensation decreased with 10 gram monofilament b/l.  Dermatological [name]: Pedal skin warm and supple b/l.   No open wounds. No interdigital macerations.  Toenails 1-5 b/l thick, discolored, elongated with subungual debris and pain on dorsal palpation.    No corns, calluses, nor porokeratotic lesions.  Musculoskeletal [name]: Muscle strength 5/5 to all lower extremity muscle groups bilaterally. Pes planus deformity noted bilateral  LE.[name] HAV with bunion deformity noted b/l LE. Hammertoe(s) 2-5 b/l.No pain, crepitus or joint limitation noted with ROM b/l LE.  Patient ambulates independently without assistive aids.  Radiographs: None     Assessment/Plan: 1. Pain due to onychomycosis of toenails of both feet   2. Pes planus of both feet   3. Acquired hammertoes of both feet   4. Hallux valgus, acquired, bilateral   5. Diabetic peripheral neuropathy associated with type 2 diabetes mellitus (HCC)   6. Encounter for diabetic foot exam (HCC)   Diabetic foot [name] performed today. All patient's and/or POA's questions/concerns addressed on today's visit. Toenails 1-5 b/l debrided in length and girth without incident. Continue foot and shoe inspections daily. Monitor blood glucose per PCP/Endocrinologist's recommendations. Continue soft, supportive shoe gear daily. Report any pedal injuries to medical professional. Call office if there are any questions/concerns. -Patient/POA to call should there be question/concern in the interim.   Return in about 3 months (around 05/04/2024).  Delon LITTIE Merlin, DPM      [place] LOCATION: [address], [place] [address]                   Office [phone]   [facility] LOCATION: [address] [place], [place] [address] Office [phone]

## 2024-05-17 ENCOUNTER — Ambulatory Visit: Admitting: Podiatry

## 2024-05-17 ENCOUNTER — Encounter: Payer: Self-pay | Admitting: Podiatry

## 2024-05-17 DIAGNOSIS — M79674 Pain in right toe(s): Secondary | ICD-10-CM | POA: Diagnosis not present

## 2024-05-17 DIAGNOSIS — E1142 Type 2 diabetes mellitus with diabetic polyneuropathy: Secondary | ICD-10-CM

## 2024-05-17 DIAGNOSIS — B351 Tinea unguium: Secondary | ICD-10-CM

## 2024-05-17 DIAGNOSIS — M79675 Pain in left toe(s): Secondary | ICD-10-CM

## 2024-05-20 ENCOUNTER — Encounter: Payer: Self-pay | Admitting: Podiatry

## 2024-08-23 ENCOUNTER — Ambulatory Visit: Admitting: Podiatry
